# Patient Record
Sex: Female | Born: 1970 | Hispanic: No | Marital: Married | Smoking: Never smoker
Health system: Southern US, Community
[De-identification: ages and names within clinical notes are randomized; demographics above are authoritative.]

## PROBLEM LIST (undated history)

## (undated) DIAGNOSIS — F419 Anxiety disorder, unspecified: Secondary | ICD-10-CM

## (undated) DIAGNOSIS — N879 Dysplasia of cervix uteri, unspecified: Secondary | ICD-10-CM

## (undated) DIAGNOSIS — E282 Polycystic ovarian syndrome: Secondary | ICD-10-CM

## (undated) DIAGNOSIS — F32A Depression, unspecified: Secondary | ICD-10-CM

## (undated) DIAGNOSIS — R5383 Other fatigue: Secondary | ICD-10-CM

## (undated) DIAGNOSIS — R7301 Impaired fasting glucose: Secondary | ICD-10-CM

## (undated) DIAGNOSIS — I82419 Acute embolism and thrombosis of unspecified femoral vein: Secondary | ICD-10-CM

## (undated) DIAGNOSIS — I10 Essential (primary) hypertension: Secondary | ICD-10-CM

## (undated) DIAGNOSIS — R5381 Other malaise: Secondary | ICD-10-CM

## (undated) DIAGNOSIS — F329 Major depressive disorder, single episode, unspecified: Secondary | ICD-10-CM

## (undated) DIAGNOSIS — E559 Vitamin D deficiency, unspecified: Secondary | ICD-10-CM

## (undated) HISTORY — PX: CHOLECYSTECTOMY: SHX55

## (undated) HISTORY — DX: Other malaise: R53.83

## (undated) HISTORY — PX: ABDOMINAL HYSTERECTOMY: SHX81

## (undated) HISTORY — DX: Essential (primary) hypertension: I10

## (undated) HISTORY — DX: Depression, unspecified: F32.A

## (undated) HISTORY — DX: Acute embolism and thrombosis of unspecified femoral vein: I82.419

## (undated) HISTORY — DX: Impaired fasting glucose: R73.01

## (undated) HISTORY — DX: Major depressive disorder, single episode, unspecified: F32.9

## (undated) HISTORY — DX: Polycystic ovarian syndrome: E28.2

## (undated) HISTORY — DX: Other fatigue: R53.81

## (undated) HISTORY — DX: Dysplasia of cervix uteri, unspecified: N87.9

## (undated) HISTORY — DX: Vitamin D deficiency, unspecified: E55.9

---

## 1997-12-31 ENCOUNTER — Other Ambulatory Visit: Admission: RE | Admit: 1997-12-31 | Discharge: 1997-12-31 | Payer: Self-pay | Admitting: *Deleted

## 1998-03-29 ENCOUNTER — Emergency Department (HOSPITAL_COMMUNITY): Admission: EM | Admit: 1998-03-29 | Discharge: 1998-03-29 | Payer: Self-pay | Admitting: Emergency Medicine

## 1998-12-29 ENCOUNTER — Other Ambulatory Visit: Admission: RE | Admit: 1998-12-29 | Discharge: 1998-12-29 | Payer: Self-pay | Admitting: *Deleted

## 1999-01-16 ENCOUNTER — Other Ambulatory Visit: Admission: RE | Admit: 1999-01-16 | Discharge: 1999-01-16 | Payer: Self-pay | Admitting: *Deleted

## 1999-02-02 ENCOUNTER — Other Ambulatory Visit: Admission: RE | Admit: 1999-02-02 | Discharge: 1999-02-02 | Payer: Self-pay | Admitting: *Deleted

## 1999-03-23 ENCOUNTER — Encounter (INDEPENDENT_AMBULATORY_CARE_PROVIDER_SITE_OTHER): Payer: Self-pay | Admitting: Specialist

## 1999-03-23 ENCOUNTER — Observation Stay (HOSPITAL_COMMUNITY): Admission: RE | Admit: 1999-03-23 | Discharge: 1999-03-24 | Payer: Self-pay | Admitting: *Deleted

## 1999-10-05 ENCOUNTER — Encounter: Admission: RE | Admit: 1999-10-05 | Discharge: 1999-10-05 | Payer: Self-pay | Admitting: Family Medicine

## 1999-10-05 ENCOUNTER — Encounter: Payer: Self-pay | Admitting: Family Medicine

## 1999-11-16 ENCOUNTER — Other Ambulatory Visit: Admission: RE | Admit: 1999-11-16 | Discharge: 1999-11-16 | Payer: Self-pay | Admitting: *Deleted

## 2000-07-20 ENCOUNTER — Encounter: Payer: Self-pay | Admitting: *Deleted

## 2000-07-20 ENCOUNTER — Encounter: Admission: RE | Admit: 2000-07-20 | Discharge: 2000-07-20 | Payer: Self-pay | Admitting: *Deleted

## 2001-02-03 ENCOUNTER — Emergency Department (HOSPITAL_COMMUNITY): Admission: EM | Admit: 2001-02-03 | Discharge: 2001-02-03 | Payer: Self-pay | Admitting: Emergency Medicine

## 2001-03-06 ENCOUNTER — Other Ambulatory Visit: Admission: RE | Admit: 2001-03-06 | Discharge: 2001-03-06 | Payer: Self-pay | Admitting: *Deleted

## 2003-01-11 ENCOUNTER — Other Ambulatory Visit: Admission: RE | Admit: 2003-01-11 | Discharge: 2003-01-11 | Payer: Self-pay | Admitting: Obstetrics and Gynecology

## 2004-01-14 ENCOUNTER — Other Ambulatory Visit: Admission: RE | Admit: 2004-01-14 | Discharge: 2004-01-14 | Payer: Self-pay | Admitting: Obstetrics and Gynecology

## 2004-09-20 LAB — HM COLONOSCOPY

## 2005-03-26 ENCOUNTER — Other Ambulatory Visit: Admission: RE | Admit: 2005-03-26 | Discharge: 2005-03-26 | Payer: Self-pay | Admitting: Obstetrics and Gynecology

## 2005-05-27 ENCOUNTER — Ambulatory Visit: Payer: Self-pay | Admitting: Internal Medicine

## 2005-07-01 ENCOUNTER — Ambulatory Visit: Payer: Self-pay | Admitting: Gastroenterology

## 2006-05-06 ENCOUNTER — Other Ambulatory Visit: Admission: RE | Admit: 2006-05-06 | Discharge: 2006-05-06 | Payer: Self-pay | Admitting: Obstetrics and Gynecology

## 2007-05-31 ENCOUNTER — Ambulatory Visit: Payer: Self-pay | Admitting: Cardiology

## 2007-06-16 ENCOUNTER — Other Ambulatory Visit: Admission: RE | Admit: 2007-06-16 | Discharge: 2007-06-16 | Payer: Self-pay | Admitting: Obstetrics and Gynecology

## 2007-08-03 ENCOUNTER — Ambulatory Visit: Payer: Self-pay

## 2007-08-03 ENCOUNTER — Ambulatory Visit: Payer: Self-pay | Admitting: Cardiology

## 2008-03-03 ENCOUNTER — Emergency Department (HOSPITAL_COMMUNITY): Admission: EM | Admit: 2008-03-03 | Discharge: 2008-03-03 | Payer: Self-pay | Admitting: Emergency Medicine

## 2008-12-09 ENCOUNTER — Encounter: Admission: RE | Admit: 2008-12-09 | Discharge: 2008-12-09 | Payer: Self-pay | Admitting: Family Medicine

## 2009-01-30 ENCOUNTER — Encounter: Admission: RE | Admit: 2009-01-30 | Discharge: 2009-01-30 | Payer: Self-pay | Admitting: Family Medicine

## 2011-02-02 NOTE — Assessment & Plan Note (Signed)
[facility]                            CARDIOLOGY OFFICE NOTE   Brianna Zimmerman, Brianna Zimmerman                      MRN:          [id number]  DATE:05/31/2007                            DOB:          [birth year]    REFERRING PHYSICIAN:  Huntley Dec M.D. Carter   REASON FOR CONSULTATION:  Evaluate patient with chest pain and an  abnormal EKG.   HISTORY OF PRESENT ILLNESS:  The patient is a very pleasant, 40 year old  female without prior cardiac history. She has had a history of chest  discomfort since about March. She was diagnosed with costochondritis.  She has been managed with some nonsteroidals, but the pain is not  improved. In fact, yesterday she had a particularly severe episode of  discomfort. She went to [facility] and was noted to have an EKG with some  premature atrial contractions as well as diffuse T-wave flattening. I do  not know if there was an old EKG for comparison.   The patient describes the chest discomfort as a lower sternal  discomfort. It does get worse when she moves in a certain position and  takes a deep breath. It was 7/10 yesterday. There was also some nausea  and diaphoresis. It does not radiate to her jaw or down her arms. It  goes away only on its own but can persist for hours or an entire day. It  is not made worse with activity. Her most exerting activity is walking  at work. She does not have any shortness of breath. She does not have  any PND or orthopnea. She does not notice any palpitations. Had no  presyncope or syncope.   PAST MEDICAL HISTORY:  1. Hypertension.  2. Diabetes for 1 year.  3. Mild sleep apnea.   PAST SURGICAL HISTORY:  Hysterectomy.   ALLERGIES:  None.   MEDICATIONS:  1. Metformin 1000 mg daily.  2. Pristiq 50 mg daily.  3. Spironolactone 25 mg daily.   SOCIAL HISTORY:  The patient is a Customer service manager. She is married. She  has one 12 year old child. She has never smoked cigarettes. She does  occasionally drink alcohol.   FAMILY HISTORY:  Is noncontributory for early coronary disease, though  her father died in his 78s, and she is not sure of what.   REVIEW OF SYSTEMS:  Positive for occasional headaches, cough, reflux.  Negative for other systems.   PHYSICAL EXAMINATION:  The patient is in no acute distress. Blood  pressure 124/82, heart rate 82 and regular, weight 232 pounds, body mass  index 34.  HEENT:  Eyes unremarkable; pupils are equal, round, and reactive to  light; fundi within normal limits. Oral mucosal unremarkable.  NECK:  No jugular venous distention at 45 degrees. Carotid upstrokes  brisk and symmetrical. No bruits. No thyromegaly.  LYMPHATICS:  No cervical, axillary, or inguinal adenopathy.  LUNGS:  Clear to auscultation bilaterally.  BACK:  No costovertebral angle tenderness.  CHEST:  Unremarkable except for mild tenderness in the lower  costochondral junction.  HEART:  PMI not displaced or sustained. S1 and S2 within normal limits.  No S3, no S4, no clicks, no rubs, no murmurs.  ABDOMEN:  Obese, positive bowel sounds, normal in frequency and pitch,  no bruits, no rebound, no guarding, no midline pulsatile mass, no  hepatomegaly, no splenomegaly.  SKIN:  No rashes, no nodules.  EXTREMITIES:  2+ pulses throughout, no edema, no cyanosis, no clubbing.  NEUROLOGICAL:  Oriented to person, place, and time; cranial nerves II-  XII grossly intact; motor grossly intact.   EKG:  Sinus rhythm with premature atrial contractions, axis within  normal limits, QT interval slightly prolonged, nonspecific inferior and  lateral T-wave flattening.   ASSESSMENT AND PLAN:  1. Chest discomfort. The patient's chest discomfort is atypical.      However, she does have diabetes. She does not know her lipid      status. She does not know her father's family history. She has      atypical chest pain with abnormal EKG. Given all of this, the      pretest probability of  obstructive coronary disease is low but      still possible. Therefore, I think screening with a regular      exercise treadmill test is reasonable. This will also allow me to      risk stratify and give her prescription for exercise.  2. Blood pressure is well controlled, and she will continue the      medications as listed (she is actually on the spironolactone for      her skin, but I am sure it is serving as a blood pressure medicine      as well).  3. Diabetes. Dr. Lucianne Muss.  4. Followup. Will see the patient again at the time of the treadmill      test. I am looking forward to being able to give her a prescription      for exercise and counseling on weight loss.     Rollene Rotunda, MD, [facility]  Electronically Signed    JH/MedQ  DD: 05/31/2007  DT: 05/31/2007  Job #: [id number]   cc:   Eileen Stanford, M.D.

## 2011-02-02 NOTE — Procedures (Signed)
[facility]                              EXERCISE TREADMILL   Brianna Zimmerman, Brianna Zimmerman                      MRN:          [id number]  DATE:08/03/2007                            DOB:          Feb 16, 1971    PRIMARY CARE PHYSICIAN:  Dr. Brandon Melnick.   REASON FOR PRESENTATION:  Evaluate patient with chest discomfort.   PROCEDURE NOTE:  The patient was exercised using standard Bruce  protocol.  She was able to exercise for 9 minutes.  This completed stage  III.  She achieved her target heart rate with a maximum of 176 which was  96% of predicted.  She achieved 10.4 mets.  She had an appropriate blood  pressure response to the maximum of 176/78.  There were some PVCs both  with exercise and at rest.  She actually had some bigemini at rest but  did not have any symptoms related to this.  She had no chest discomfort,  neck or arm discomfort.  She had appropriate dyspnea.  She had no  ischemic ST-T wave changes.  She had a normal heart rate recovery.   CONCLUSION:  Negative exercise treadmill test.  No evidence of ischemia  or high grade obstructive coronary disease.  She had low risk features.  I think her exercise tolerance though is not good, given her age.   PLAN:  Based on the above, no further cardiovascular testing is  suggested.  I did give her a prescription for exercise and we discussed  this at length.   The patient does have premature ventricular contractions but she is not  noticing these.  No further therapy is warranted.     Rollene Rotunda, MD, [facility]  Electronically Signed    JH/MedQ  DD: 08/03/2007  DT: 08/03/2007  Job #: [id number]   cc:   Brandon Melnick, Dr.

## 2011-05-02 ENCOUNTER — Other Ambulatory Visit (INDEPENDENT_AMBULATORY_CARE_PROVIDER_SITE_OTHER): Payer: Self-pay | Admitting: General Surgery

## 2011-05-02 ENCOUNTER — Emergency Department (HOSPITAL_COMMUNITY): Payer: 59

## 2011-05-02 ENCOUNTER — Inpatient Hospital Stay (HOSPITAL_COMMUNITY): Payer: 59

## 2011-05-02 ENCOUNTER — Inpatient Hospital Stay (HOSPITAL_COMMUNITY)
Admission: EM | Admit: 2011-05-02 | Discharge: 2011-05-03 | DRG: 419 | Disposition: A | Discharge status: Home / Self Care | Payer: 59 | Attending: Surgery | Admitting: Surgery

## 2011-05-02 DIAGNOSIS — E282 Polycystic ovarian syndrome: Secondary | ICD-10-CM | POA: Diagnosis present

## 2011-05-02 DIAGNOSIS — F3289 Other specified depressive episodes: Secondary | ICD-10-CM | POA: Diagnosis present

## 2011-05-02 DIAGNOSIS — K8000 Calculus of gallbladder with acute cholecystitis without obstruction: Principal | ICD-10-CM | POA: Diagnosis present

## 2011-05-02 DIAGNOSIS — R1013 Epigastric pain: Secondary | ICD-10-CM

## 2011-05-02 DIAGNOSIS — R1011 Right upper quadrant pain: Secondary | ICD-10-CM

## 2011-05-02 DIAGNOSIS — F411 Generalized anxiety disorder: Secondary | ICD-10-CM | POA: Diagnosis present

## 2011-05-02 DIAGNOSIS — F329 Major depressive disorder, single episode, unspecified: Secondary | ICD-10-CM | POA: Diagnosis present

## 2011-05-02 DIAGNOSIS — K8010 Calculus of gallbladder with chronic cholecystitis without obstruction: Secondary | ICD-10-CM

## 2011-05-02 DIAGNOSIS — R112 Nausea with vomiting, unspecified: Secondary | ICD-10-CM | POA: Diagnosis present

## 2011-05-02 LAB — URINALYSIS, ROUTINE W REFLEX MICROSCOPIC
Bilirubin Urine: NEGATIVE
Glucose, UA: NEGATIVE mg/dL
Hgb urine dipstick: NEGATIVE
Ketones, ur: NEGATIVE mg/dL
Leukocytes, UA: NEGATIVE
Nitrite: NEGATIVE
Protein, ur: NEGATIVE mg/dL
Specific Gravity, Urine: 1.024 (ref 1.005–1.030)
Urobilinogen, UA: 1 mg/dL (ref 0.0–1.0)
pH: 6.5 (ref 5.0–8.0)

## 2011-05-02 LAB — COMPREHENSIVE METABOLIC PANEL
ALT: 19 U/L (ref 0–35)
Alkaline Phosphatase: 88 U/L (ref 39–117)
BUN: 9 mg/dL (ref 6–23)
CO2: 22 mEq/L (ref 19–32)
Calcium: 9 mg/dL (ref 8.4–10.5)
GFR calc Af Amer: >60 mL/min (ref >60)
GFR calc non Af Amer: >60 mL/min (ref >60)
Glucose, Bld: 132 mg/dL — ABNORMAL HIGH (ref 70–99)
Sodium: 135 mEq/L (ref 135–145)
Total Protein: 7.4 g/dL (ref 6.0–8.3)

## 2011-05-02 LAB — DIFFERENTIAL
Basophils Relative: 0 % (ref 0–1)
Eosinophils Absolute: 0.2 10*3/uL (ref 0.0–0.7)
Eosinophils Relative: 2 % (ref 0–5)
Lymphocytes Relative: 19 % (ref 12–46)
Monocytes Relative: 6 % (ref 3–12)
Neutro Abs: 8 10*3/uL — ABNORMAL HIGH (ref 1.7–7.7)
Neutrophils Relative %: 73 % (ref 43–77)

## 2011-05-02 LAB — CBC
HCT: 42.1 % (ref 36.0–46.0)
Hemoglobin: 14.8 g/dL (ref 12.0–15.0)
MCH: 30.6 pg (ref 26.0–34.0)
MCHC: 35.2 g/dL (ref 30.0–36.0)
RBC: 4.83 MIL/uL (ref 3.87–5.11)

## 2011-05-02 LAB — LIPASE, BLOOD: Lipase: 31 U/L (ref 11–59)

## 2011-05-03 LAB — URINE CULTURE

## 2011-05-11 NOTE — H&P (Signed)
NAMELAQUANDA, BICK NO.:  [id number]  MEDICAL RECORD NO.:  [id number]  LOCATION:  1527                         FACILITY:  [facility]  PHYSICIAN:  Lodema Pilot, MD       DATE OF BIRTH:  [birth year]  DATE OF ADMISSION:  05/02/2011 DATE OF DISCHARGE:  05/03/2011                             HISTORY & PHYSICAL   CHIEF COMPLAINT:  Abdominal pain.  HISTORY OF PRESENT ILLNESS:  This is a 40 year old female who awoke from sleep on Saturday (yesterday) with right upper quadrant pain, which she describes as "extreme cramping." She states that the pain radiates across her upper abdomen and has been unrelenting, which caused her to present to [facility].  She took some Motrin and she had some mild improvement with that.  However, the pain has been consistent since yesterday morning.  She has had associated nausea and nonbloody bloody emesis x1.  She denies any other associated symptoms.  She had 1 episode of nonbloody emesis.  She has no other exacerbating or relieving factors.  She denies any specific food association.  She states that her bowels are normal and denies any hematochezia or melena.  She denies any fevers or chills.  She denies any jaundice or reflux symptoms.  She denies any dysuria or hematuria.  PAST MEDICAL HISTORY:  Positive for polycystic ovarian syndrome and anxiety.  PAST SURGICAL HISTORY:  Hysterectomy.  FAMILY HISTORY:  Her mother had gallstones, otherwise family history is noncontributory.  SOCIAL HISTORY:  She denies tobacco use, illegal drugs, or alcohol use. She is married and lives with her husband.  REVIEW OF SYSTEMS:  Otherwise review of systems is negative except for as in HPI.  MEDICATIONS:  She is on Lexapro, oral contraceptive pills, and spironolactone.  ALLERGIES:  None.  PHYSICAL EXAMINATION:  GENERAL:  She is in no acute distress and nontoxic appearing, alert and oriented x3 sitting up in the bed. VITAL SIGNS:   Reviewed and are within normal limits and she is afebrile. HEENT:  Head is normocephalic and atraumatic.  Ears, nose, mouth, and throat are normal.  Her mucous membranes are moist and warm.  No evidence of bleeding or obvious lesions.  Sclerae are white. Conjunctivae normal. NECK:  Demonstrates a midline trachea with no stridor.  No palpable masses. LUNGS:  Clear to auscultation bilaterally without wheezes or rales and she has a normal respiratory effort.  No evidence of respiratory distress. HEART:  Normal to auscultation with normal rate and a regular rhythm. Pulses are normal and equal in all 4 extremities. ABDOMEN:  Soft and nondistended.  She has some mild-to-moderate right upper quadrant and epigastric tenderness, but no masses and no peritonitis and no evidence of hernia. MUSCULOSKELETAL:  Normal with full range of motion and normal strength. SKIN:  Normal with no obvious lesions and is warm and dry. NEUROLOGIC/PSYCHIATRIC:  Evaluation is normal with normal affect and mood and she responds appropriately to all questions. LABORATORY STUDIES:  Chemistry showed a white count 11.0, hemoglobin is 14.8, hematocrit is 42.1, platelets 208, 73% neutrophils.  Sodium is 135, potassium 3.9, chloride 101, bicarb is 22, BUN 9.  Creatinine is 0.67, glucose 132,  total bilirubin is 0.4, alk phos 88, AST is 21, ALT is 19, lipase 31.  UA is negative.  RADIOGRAPHIC STUDIES:  She had an ultrasound of the abdomen, which demonstrates gallstones with a normal common bile duct, no wall thickening.  She had a sonographic Murphy's sign.  ASSESSMENT:  Symptomatic cholelithiasis with possible cholecystitis given the duration of her symptoms.  PLAN:  We will plan for admission and IV fluid administration.  We will plan for laparoscopic cholecystectomy with intraoperative cholangiogram as soon as the operating room is available.  I discussed with her the risks of the procedure including infection,  bleeding, pain, scarring, persistent symptoms, diarrhea, retained stones, and need for open surgery and injury to bowel or bile ducts.  She expressed understanding and desires to proceed with laparoscopic cholecystectomy with intraoperative cholangiogram.          ______________________________ Lodema Pilot, MD     BL/MEDQ  D:  05/07/2011  T:  05/07/2011  Job:  [id number]  Electronically Signed by Lodema Pilot DO on 05/11/2011 09:45:50 AM

## 2011-05-17 NOTE — Op Note (Signed)
Brianna Zimmerman, Brianna Zimmerman NO.:  [id number]  MEDICAL RECORD NO.:  [id number]  LOCATION:  1527                         FACILITY:  [facility]  PHYSICIAN:  Sharlet Salina T. Latiqua Daloia, M.D.DATE OF BIRTH:  [birth year]  DATE OF PROCEDURE:  05/02/2011 DATE OF DISCHARGE:                              OPERATIVE REPORT   PREOPERATIVE. DIAGNOSES:  Cholelithiasis and acute cholecystitis.  POSTOPERATIVE DIAGNOSES:  Cholelithiasis and acute cholecystitis.  SURGICAL PROCEDURES:  Laparoscopic cholecystectomy with intraoperative cholangiogram.  SURGEON:  Sharlet Salina T. Deliana Avalos, M.D.  ANESTHESIA:  General.  BRIEF HISTORY:  This patient is a 40 year old female who presents with acute onset of severe epigastric and right upper quadrant abdominal pain.  Workup with a gallbladder ultrasound showing multiple gallstones, but no evidence of acute cholecystitis.  She has persistent pain and tenderness, so we have recommended proceeding with an urgent laparoscopic cholecystectomy with cholangiogram.  We discussed the nature of the procedure, the indications, the risks of anesthetic complications, bleeding, infection, bile leak, bile duct injury were discussed with husband.  She is agreeable and was brought to the operating room for this procedure.  DESCRIPTION OF OPERATION:  The patient was brought to the operating room, placed in supine position on the operating table and general endotracheal anesthesia was induced.  The abdomen was widely sterilely prepped and draped.  She had received preoperative IV antibiotics, and PAS were in place.  Correct patient and procedure were verified.  Local anesthesia was used to infiltrate trocar sites.  A 1.5 cm incision was made at the umbilicus.  Dissection was carried down to the midline fascia.  This is elevated between clamps and the fascia was incised for 1 cm and the peritoneum entered under direct vision.  Through a mattress suture of 0 Vicryl, the Hassan  trocar was placed and pneumoperitoneum established.  Under direct vision, a 11 cm  trocar was placed in subxiphoid area and two 5 mm trocars on the right subcostal margin.  The gallbladder was visualized and was tense and edematous. It was aspirated and decompressed .  The fundus was grasped and elevated up over the liver.  The infundibulum was exposed and retracted inferolaterally.  Tissue was very edematous. but  dissected relatively easily.  The peritoneum was incised anterior and posterior to Calot's triangle.  The fibrofatty tissue was stripped off the neck of the gallbladder toward the porta hepatis.  The distal gallbladder and Calot's triangle was thoroughly dissected.  The cystic duct gallbladder junction was identified and dissected to 360 degrees.  The cyst duct dissected out over about  1 cm. When the anatomy was clear, the cystic duct was clipped at the gallbladder junction and operative cholangiogram was obtained through the cystic duct.  This showed good filling of normal common bile duct, intrahepatic ducts with free flow into the duodenum and no filling defects.  The catheter was removed and the cystic duct was doubly clipped proximally and divided.  The anterior and posterior branches of the cystic artery were then clipped at the Calot's triangle and divided.  The gallbladder was dissected free from its bed using hook cautery, placed in an EndoCatch bag, and brought out through the umbilicus.  Complete  hemostasis was obtained of the gallbladder bed with cautery, and the right upper quadrant was thoroughly irrigated and suctioned to clear.  The Surgicel pack was left in the gallbladder fossa.  All CO2 was evacuated, trocars were removed, and the mattress sutures were secured at the  umbilicus.  Skin incisions were closed with subcuticular Monocryl and Dermabond.  Sponges and instrument counts were correct.  The patient was taken to recovery room in  good condition.     Lorne Skeens. Kalyse Meharg, M.D.     Tory Emerald  D:  05/02/2011  T:  05/03/2011  Job:  [id number]  Electronically Signed by Glenna Fellows M.D. on 05/17/2011 11:22:14 AM

## 2011-05-18 ENCOUNTER — Ambulatory Visit (INDEPENDENT_AMBULATORY_CARE_PROVIDER_SITE_OTHER): Payer: 59 | Admitting: Radiology

## 2011-05-18 ENCOUNTER — Encounter (INDEPENDENT_AMBULATORY_CARE_PROVIDER_SITE_OTHER): Payer: Self-pay

## 2011-05-18 DIAGNOSIS — K8010 Calculus of gallbladder with chronic cholecystitis without obstruction: Secondary | ICD-10-CM

## 2011-05-18 NOTE — Progress Notes (Signed)
Brianna Zimmerman is a 40 y.o. female who had a laparoscopic cholecystectomy with intraoperative cholangiogram.  The pathology report confirmed gallstobes and cholecystitis.  The patient reports that they are feeling well with normal bowel movements and good appetite.  The pre-operative symptoms of abdominal pain, nausea, and vomiting have resolved.    Physical examination - Incisions appear well-healed with no sign of infection or bleeding.   Abdomen - soft, non-tender  Impression:  s/p laparoscopic cholecystectomy  Plan:  She may resume a regular diet and full activity.  She may follow-up on a PRN basis.

## 2011-05-20 NOTE — Discharge Summary (Signed)
  [name], Zimmerman NO.:  [id number]  MEDICAL RECORD NO.:  [id number]  LOCATION:  1527                         FACILITY:  [facility]  PHYSICIAN:  Ardeth Sportsman, MD     DATE OF BIRTH:  09/18/1971  DATE OF ADMISSION:  05/02/2011 DATE OF DISCHARGE:  05/03/2011                              DISCHARGE SUMMARY   ADMISSION DIAGNOSES:  Cholelithiasis and acute cholecystitis.  DISCHARGE DIAGNOSES:  Cholelithiasis and acute cholecystitis.  PROCEDURES:  Laparoscopic cholecystectomy with intraoperative cholangiogram.  BRIEF HISTORY:  The patient is a 40 year old female who woke up from her sleep with right upper quadrant pain, which she describes as extreme cramping radiates across her upper abdomen, improved with pain medicines.  She underwent workup in the emergency room at [facility].  Ultrasound shows small gallstones, common bile duct was normal.  There was no wall thickening, but a positive Murphy sign.  She was evaluated by Dr. Biagio Quint and it was his impression she had acute cholecystitis and cholelithiasis.  She was admitted for treatment.  PAST MEDICAL HISTORY: 1. Polycystic ovarian syndrome. 2. Anxiety.  PAST SURGICAL HISTORY:  Hysterectomy.  MEDICATIONS:  Lexapro, birth control pills, and spirolactone  ALLERGIES:  None.  For further history and physical, please see the dictated note.  HOSPITAL COURSE:  The patient was admitted by Dr. Biagio Quint, taken to the OR on May 02, 2011, by Dr. Johna Sheriff who did a laparoscopic cholecystectomy, intraoperative arteriogram.  The patient tolerated the procedure well, returned to the floor in satisfactory condition.  Her diet has been advanced, and she has regular diet for breakfast.  She is still tender.  We plan to mobilize her, and adjust her pain medicines.   Her wounds all looks good.  If she continues do well, we anticipate discharge home after lunch.  She will go home on a preadmission medicines, which  include ibuprofen 200 mg 2 q.8 h. p.r.n., Ambien 10 mg h.s. p.r.n., Lexapro 10 mg daily, estradiol/norgestimate 1 daily, and spironolactone 25 mg daily.  She will return for followup at out clinic on May 18, 2011, at 2:45.  She was instructed to clean her wounds with plain soap and water.  Call for fever over 101, trouble urinating, any bleeding or redness or drainage from incision or increased abdominal pain.  New medicines will be oxycodone-APAP 5/325 mg 1-2 p.o. q.4 h. p.r.n..  CONDITION ON DISCHARGE:  Improved.     [name], P.A.   ______________________________ Ardeth Sportsman, MD    WDJ/MEDQ  D:  05/03/2011  T:  05/03/2011  Job:  [id number]  Electronically Signed by Sherrie George P.A. on 05/11/2011 06:21:02 PM Electronically Signed by Karie Soda MD on 05/20/2011 08:16:24 AM

## 2011-07-15 ENCOUNTER — Other Ambulatory Visit: Payer: Self-pay | Admitting: Obstetrics and Gynecology

## 2011-07-15 DIAGNOSIS — Z1231 Encounter for screening mammogram for malignant neoplasm of breast: Secondary | ICD-10-CM

## 2011-08-02 ENCOUNTER — Ambulatory Visit
Admission: RE | Admit: 2011-08-02 | Discharge: 2011-08-02 | Disposition: A | Discharge status: Home / Self Care | Payer: 59 | Source: Ambulatory Visit | Attending: Obstetrics and Gynecology | Admitting: Obstetrics and Gynecology

## 2011-08-02 DIAGNOSIS — Z1231 Encounter for screening mammogram for malignant neoplasm of breast: Secondary | ICD-10-CM

## 2011-08-06 ENCOUNTER — Emergency Department (HOSPITAL_COMMUNITY)
Admission: EM | Admit: 2011-08-06 | Discharge: 2011-08-07 | Disposition: A | Discharge status: Home / Self Care | Payer: 59 | Attending: Emergency Medicine | Admitting: Emergency Medicine

## 2011-08-06 ENCOUNTER — Emergency Department (HOSPITAL_COMMUNITY)
Admission: EM | Admit: 2011-08-06 | Discharge: 2011-08-06 | Discharge status: Against Medical Advice | Payer: 59 | Attending: Emergency Medicine | Admitting: Emergency Medicine

## 2011-08-06 ENCOUNTER — Encounter (HOSPITAL_COMMUNITY): Payer: Self-pay | Admitting: Emergency Medicine

## 2011-08-06 DIAGNOSIS — I82409 Acute embolism and thrombosis of unspecified deep veins of unspecified lower extremity: Secondary | ICD-10-CM | POA: Insufficient documentation

## 2011-08-06 DIAGNOSIS — M7989 Other specified soft tissue disorders: Secondary | ICD-10-CM | POA: Insufficient documentation

## 2011-08-06 DIAGNOSIS — M79609 Pain in unspecified limb: Secondary | ICD-10-CM | POA: Insufficient documentation

## 2011-08-06 DIAGNOSIS — I82402 Acute embolism and thrombosis of unspecified deep veins of left lower extremity: Secondary | ICD-10-CM

## 2011-08-06 HISTORY — DX: Anxiety disorder, unspecified: F41.9

## 2011-08-06 MED ORDER — HYDROCODONE-ACETAMINOPHEN 5-500 MG PO TABS: 1.0000 | ORAL_TABLET | Freq: Four times a day (QID) | ORAL | Status: AC | PRN

## 2011-08-06 MED ORDER — ENOXAPARIN (LOVENOX) PATIENT EDUCATION KIT
PACK | Freq: Once | Status: DC
  Filled 2011-08-06: qty 1

## 2011-08-06 MED ORDER — OXYCODONE-ACETAMINOPHEN 5-325 MG PO TABS
1.0000 | ORAL_TABLET | Freq: Once | ORAL | Status: AC
  Administered 2011-08-06: 1 via ORAL
  Filled 2011-08-06: qty 1

## 2011-08-06 MED ORDER — ENOXAPARIN SODIUM 100 MG/ML ~~LOC~~ SOLN
100.0000 mg | Freq: Once | SUBCUTANEOUS | Status: AC
  Administered 2011-08-06: 100 mg via SUBCUTANEOUS
  Filled 2011-08-06: qty 1

## 2011-08-06 MED ORDER — ENOXAPARIN SODIUM 150 MG/ML ~~LOC~~ SOLN: 1.5000 mg/kg | Freq: Every day | SUBCUTANEOUS | Status: AC

## 2011-08-06 NOTE — ED Provider Notes (Signed)
History    patient presents to the ED complaining of left lower leg swelling and pain for one day. Patient states she was walking yesterday when she noticed increasing pain to the left calf. The next day she noticed swelling to her left calf, therefore she visited a local [facility] for further evaluation. A venous Doppler ultrasound was obtained which shows evidence of a DVT to left lower extremity. Patient denies history of DVT in the past. She does admits to using birth control pills, but denies any recent surgery, prolonged bed rest, or long trip.  Patient denies fever, chest pain, shortness of breath, nausea, vomiting, diarrhea, abdominal pain, rash, weakness, numbness.    CSN: [id number] Arrival date & time: 08/06/2011  8:16 PM   First MD Initiated Contact with Patient 08/06/11 2152      Chief Complaint  Patient presents with  . DVT    (Consider location/radiation/quality/duration/timing/severity/associated sxs/prior treatment) HPI  Past Medical History  Diagnosis Date  . Anxiety     Past Surgical History  Procedure Date  . Abdominal hysterectomy   . Cholecystectomy     Family History  Problem Relation Age of Onset  . Cancer Father     History  Substance Use Topics  . Smoking status: Never Smoker   . Smokeless tobacco: Not on file  . Alcohol Use: Yes    OB History    Grav Para Term Preterm Abortions TAB SAB Ect Mult Living                  Review of Systems  All other systems reviewed and are negative.    Allergies  Tavist  Home Medications   Current Outpatient Rx  Name Route Sig Dispense Refill  . RISAQUAD PO CAPS Oral Take 1 capsule by mouth daily.      Marland Kitchen ESCITALOPRAM OXALATE 10 MG PO TABS Oral Take 10 mg by mouth at bedtime.     . OMEGA-3 FATTY ACIDS 1000 MG PO CAPS Oral Take 1 g by mouth daily.      Marland Kitchen NORGESTIMATE-ETH ESTRADIOL 0.25-35 MG-MCG PO TABS Oral Take 1 tablet by mouth daily.      Marland Kitchen ZOLPIDEM TARTRATE 10 MG PO TABS Oral Take 5 mg by  mouth at bedtime as needed. SLEEP      BP 185/105  Pulse 77  Temp(Src) 97.8 F (36.6 C) (Oral)  Resp 20  SpO2 97%  Physical Exam  Constitutional: She is oriented to person, place, and time. She appears well-developed and well-nourished. No distress.  HENT:  Head: Normocephalic and atraumatic.  Eyes: Conjunctivae are normal.  Neck: Normal range of motion. Neck supple.  Cardiovascular: Normal rate and regular rhythm.  Exam reveals no gallop and no friction rub.   No murmur heard. Pulmonary/Chest: Effort normal. No respiratory distress. She has no wheezes.  Abdominal: Soft. Bowel sounds are normal. There is no tenderness.  Musculoskeletal: Normal range of motion. She exhibits edema and tenderness.       Left lower leg: She exhibits tenderness, swelling and edema.       L lower leg with evidence of increase calf swelling L>R and tenderness on palpation.  No obvious rash, or other overlying skin changes.  Pedal pulse 2+ bilat  Neurological: She is alert and oriented to person, place, and time.    ED Course  Procedures (including critical care time)  Labs Reviewed - No data to display No results found.   No diagnosis found.    MDM  Ultrasounds of left lower extremities were performed by [facility] ([address] [place], [place] [phone]). On 08/06/2011 at 1612. Impression: Deep venous thrombosis in the left lower extremity is noted.  Patient has been given Lovenox, with appropriate instruction by pharmacy. She'll be discharged with Lovenox kit and with appropriate followup .  Patient voiced understanding, and agreement with plan. At this time she has no chest pain or shortness of breath concerning for PE.   11:37 PM Patient weight 218 pounds. Therefore, patient will be receiving Lovenox 150 mg subcutaneous daily. She will have her INR checked in the next 2-3 days. She will continue to be on Lovenox until her INR normalized.  Pt care has been discussed with my  attending, who agrees with plan.       Fayrene Helper, PA 08/06/11 [phone]

## 2011-08-06 NOTE — ED Notes (Signed)
Pt presenting to ed with c/o r/o dvt to left lower extremity. Pt had ultrasound performed today and was told to present to ed. Pt reports some swelling noted to left leg since last night

## 2011-08-06 NOTE — ED Provider Notes (Signed)
Medical screening examination/treatment/procedure(s) were conducted as a shared visit with non-physician practitioner(s) and myself.  I personally evaluated the patient during the encounter  Donnetta Hutching, MD 08/06/11 2358

## 2011-08-06 NOTE — ED Notes (Signed)
Patient presents with c/o pain to the left leg that started yesterday behind her left knee.  + popliteal pulse present, and dorsal pedis pulses.  Feet warm to touch, moves all ext well.  Patient was seen at [facility] and has paperwork with her.

## 2011-08-06 NOTE — ED Notes (Signed)
Pt report DVT in L lower leg.  Reports pain since last night.  Pt had [place] at [facility] and was told to come to ED.

## 2011-08-07 NOTE — ED Notes (Signed)
Removed iv

## 2011-08-07 NOTE — ED Notes (Signed)
Patient inst on use of Lovenox

## 2011-08-10 ENCOUNTER — Other Ambulatory Visit: Payer: Self-pay | Admitting: Obstetrics and Gynecology

## 2011-08-10 DIAGNOSIS — R928 Other abnormal and inconclusive findings on diagnostic imaging of breast: Secondary | ICD-10-CM

## 2011-08-25 ENCOUNTER — Ambulatory Visit
Admission: RE | Admit: 2011-08-25 | Discharge: 2011-08-25 | Disposition: A | Discharge status: Home / Self Care | Payer: 59 | Source: Ambulatory Visit | Attending: Obstetrics and Gynecology | Admitting: Obstetrics and Gynecology

## 2011-08-25 DIAGNOSIS — R928 Other abnormal and inconclusive findings on diagnostic imaging of breast: Secondary | ICD-10-CM

## 2012-01-24 ENCOUNTER — Other Ambulatory Visit: Payer: Self-pay | Admitting: Obstetrics and Gynecology

## 2012-01-24 DIAGNOSIS — N63 Unspecified lump in unspecified breast: Secondary | ICD-10-CM

## 2012-05-11 ENCOUNTER — Ambulatory Visit
Admission: RE | Admit: 2012-05-11 | Discharge: 2012-05-11 | Disposition: A | Discharge status: Home / Self Care | Payer: BC Managed Care – PPO | Source: Ambulatory Visit | Attending: Obstetrics and Gynecology | Admitting: Obstetrics and Gynecology

## 2012-05-11 ENCOUNTER — Other Ambulatory Visit: Payer: Self-pay | Admitting: Obstetrics and Gynecology

## 2012-05-11 DIAGNOSIS — N63 Unspecified lump in unspecified breast: Secondary | ICD-10-CM

## 2012-10-26 ENCOUNTER — Other Ambulatory Visit: Payer: Self-pay | Admitting: Obstetrics and Gynecology

## 2012-10-26 DIAGNOSIS — N63 Unspecified lump in unspecified breast: Secondary | ICD-10-CM

## 2012-11-09 ENCOUNTER — Ambulatory Visit
Admission: RE | Admit: 2012-11-09 | Discharge: 2012-11-09 | Disposition: A | Discharge status: Home / Self Care | Payer: BC Managed Care – PPO | Source: Ambulatory Visit | Attending: Obstetrics and Gynecology | Admitting: Obstetrics and Gynecology

## 2012-11-09 DIAGNOSIS — N63 Unspecified lump in unspecified breast: Secondary | ICD-10-CM

## 2012-12-27 ENCOUNTER — Encounter: Payer: Self-pay | Admitting: Family Medicine

## 2012-12-27 ENCOUNTER — Ambulatory Visit (INDEPENDENT_AMBULATORY_CARE_PROVIDER_SITE_OTHER): Payer: BC Managed Care – PPO | Admitting: Family Medicine

## 2012-12-27 VITALS — BP 131/84 | HR 64 | Wt 229.0 lb

## 2012-12-27 DIAGNOSIS — F3289 Other specified depressive episodes: Secondary | ICD-10-CM

## 2012-12-27 DIAGNOSIS — F411 Generalized anxiety disorder: Secondary | ICD-10-CM

## 2012-12-27 DIAGNOSIS — F32A Depression, unspecified: Secondary | ICD-10-CM

## 2012-12-27 DIAGNOSIS — F329 Major depressive disorder, single episode, unspecified: Secondary | ICD-10-CM

## 2012-12-27 DIAGNOSIS — K5900 Constipation, unspecified: Secondary | ICD-10-CM

## 2012-12-27 MED ORDER — FLUOXETINE HCL 40 MG PO CAPS: 40.0000 mg | ORAL_CAPSULE | Freq: Every day | ORAL | Status: DC

## 2012-12-27 MED ORDER — CITALOPRAM HYDROBROMIDE 40 MG PO TABS: 40.0000 mg | ORAL_TABLET | Freq: Every day | ORAL | Status: DC

## 2012-12-27 NOTE — Progress Notes (Signed)
  Subjective:    Patient ID: Brianna Zimmerman, female    DOB: March 25, 1971, 42 y.o.   MRN: [id number]  HPI  Tobi is here today to discuss a couple of issues. She needs to have her Celexa refilled.  She also wants to discuss her worsening constipation. She recently started taking a supplement called Cilica which is supposed to help with hair growth.  She has been trying an OTC fiber supplement which has not helped very much.     Review of Systems  Gastrointestinal: Positive for constipation. Negative for blood in stool.  Psychiatric/Behavioral: The patient is not nervous/anxious (Mood is good on Celexa).        Objective:   Physical Exam  Constitutional: She appears well-nourished. No distress.  Cardiovascular: Normal rate, regular rhythm and normal heart sounds.   Abdominal: Soft. Bowel sounds are normal. She exhibits no distension and no mass. There is no tenderness.  Psychiatric: She has a normal mood and affect. Her behavior is normal. Judgment and thought content normal.          Assessment & Plan:  1) Constipation  A)  Water  B)  Probiotics (Lemon Ginger Tea, Align, Activia)   C)  Fiber ( at least 30 grams per day) Veggies, Fruit, Fiber One, Bars)   D)  Exercise   E)  OTC Meds - Miralax (Prune Juice v PlumSmart vs Apple ) vs Stool Softener (Colace) vs Glycerin Suppository -  Avoid Senna Products  F)  RX - Amitiza vs Linzess   2)  Anxiety  A)  Refilled her Celexa

## 2012-12-27 NOTE — Patient Instructions (Addendum)
1) Constipation  A)  Water  B)  Probiotics (Lemon Ginger Tea, Align, Activia)   C)  Fiber ( at least 30 grams per day) Veggies, Fruit, Fiber One, Bars)   D)  Exercise   E)  OTC Meds - Miralax (Prune Juice v PlumSmart vs Apple ) vs Stool Softener (Colace) vs Glycerin Suppository -  Avoid Senna Products  F)  RX - Amitiza vs Linzess   Constipation, Adult Constipation is when a person has fewer than 3 bowel movements a week; has difficulty having a bowel movement; or has stools that are dry, hard, or larger than normal. As people grow older, constipation is more common. If you try to fix constipation with medicines that make you have a bowel movement (laxatives), the problem may get worse. Long-term laxative use may cause the muscles of the colon to become weak. A low-fiber diet, not taking in enough fluids, and taking certain medicines may make constipation worse. CAUSES   Certain medicines, such as antidepressants, pain medicine, iron supplements, antacids, and water pills.   Certain diseases, such as diabetes, irritable bowel syndrome (IBS), thyroid disease, or depression.   Not drinking enough water.   Not eating enough fiber-rich foods.   Stress or travel.  Lack of physical activity or exercise.  Not going to the restroom when there is the urge to have a bowel movement.  Ignoring the urge to have a bowel movement.  Using laxatives too much. SYMPTOMS   Having fewer than 3 bowel movements a week.   Straining to have a bowel movement.   Having hard, dry, or larger than normal stools.   Feeling full or bloated.   Pain in the lower abdomen.  Not feeling relief after having a bowel movement. DIAGNOSIS  Your caregiver will take a medical history and perform a physical exam. Further testing may be done for severe constipation. Some tests may include:   A barium enema X-ray to examine your rectum, colon, and sometimes, your small intestine.  A sigmoidoscopy to  examine your lower colon.  A colonoscopy to examine your entire colon. TREATMENT  Treatment will depend on the severity of your constipation and what is causing it. Some dietary treatments include drinking more fluids and eating more fiber-rich foods. Lifestyle treatments may include regular exercise. If these diet and lifestyle recommendations do not help, your caregiver may recommend taking over-the-counter laxative medicines to help you have bowel movements. Prescription medicines may be prescribed if over-the-counter medicines do not work.  HOME CARE INSTRUCTIONS   Increase dietary fiber in your diet, such as fruits, vegetables, whole grains, and beans. Limit high-fat and processed sugars in your diet, such as [place] fries, hamburgers, cookies, candies, and soda.   A fiber supplement may be added to your diet if you cannot get enough fiber from foods.   Drink enough fluids to keep your urine clear or pale yellow.   Exercise regularly or as directed by your caregiver.   Go to the restroom when you have the urge to go. Do not hold it.  Only take medicines as directed by your caregiver. Do not take other medicines for constipation without talking to your caregiver first. SEEK IMMEDIATE MEDICAL CARE IF:   You have bright red blood in your stool.   Your constipation lasts for more than 4 days or gets worse.   You have abdominal or rectal pain.   You have thin, pencil-like stools.  You have unexplained weight loss. MAKE SURE YOU:  Understand these instructions.  Will watch your condition.  Will get help right away if you are not doing well or get worse. Document Released: 06/04/2004 Document Revised: 11/29/2011 Document Reviewed: 08/10/2011 [facility] Patient Information 2013 [place].

## 2012-12-31 DIAGNOSIS — F411 Generalized anxiety disorder: Secondary | ICD-10-CM | POA: Insufficient documentation

## 2012-12-31 DIAGNOSIS — F32A Depression, unspecified: Secondary | ICD-10-CM | POA: Insufficient documentation

## 2012-12-31 DIAGNOSIS — K5900 Constipation, unspecified: Secondary | ICD-10-CM | POA: Insufficient documentation

## 2012-12-31 DIAGNOSIS — F329 Major depressive disorder, single episode, unspecified: Secondary | ICD-10-CM | POA: Insufficient documentation

## 2013-01-01 ENCOUNTER — Telehealth: Payer: Self-pay | Admitting: *Deleted

## 2013-01-01 NOTE — Telephone Encounter (Signed)
Pt has started taking probiotics.  She will contact us for samples of Amitiza or Linzess if her symptoms do not improve. PG

## 2013-02-19 ENCOUNTER — Ambulatory Visit: Payer: BC Managed Care – PPO | Admitting: Internal Medicine

## 2013-02-19 VITALS — BP 142/104 | HR 80 | Temp 98.7°F | Resp 16 | Ht 69.5 in | Wt 222.0 lb

## 2013-02-19 DIAGNOSIS — R05 Cough: Secondary | ICD-10-CM

## 2013-02-19 DIAGNOSIS — H9203 Otalgia, bilateral: Secondary | ICD-10-CM

## 2013-02-19 DIAGNOSIS — H9209 Otalgia, unspecified ear: Secondary | ICD-10-CM

## 2013-02-19 DIAGNOSIS — R509 Fever, unspecified: Secondary | ICD-10-CM

## 2013-02-19 MED ORDER — AZITHROMYCIN 250 MG PO TABS: ORAL_TABLET | ORAL | Status: DC

## 2013-02-19 MED ORDER — HYDROCODONE-HOMATROPINE 5-1.5 MG/5ML PO SYRP: 5.0000 mL | ORAL_SOLUTION | Freq: Four times a day (QID) | ORAL | Status: DC | PRN

## 2013-02-20 NOTE — Progress Notes (Signed)
  Subjective:    Patient ID: Brianna Zimmerman, female    DOB: 1971-02-14, 42 y.o.   MRN: [id number]  HPI complaining of sore throat with fatigue for 72 hours sudden onset/ cough this started on day 2 and a slightly productive/no rhinorrhea/no fever unless low-grade Both ears hurt without change in hearing  Patient Active Problem List   Diagnosis Date Noted  . Depression 12/31/2012  . Anxiety state, unspecified 12/31/2012  . Unspecified constipation 12/31/2012   Dr Alberteen Sam  Review of Systems Noncontributory     Objective:   Physical Exam BP 142/104  Pulse 80  Temp(Src) 98.7 F (37.1 C) (Oral)  Resp 16  Ht 5' 9.5" (1.765 m)  Wt 222 lb (100.699 kg)  BMI 32.32 kg/m2  SpO2 96% TMs clear/nares clear Oropharynx injected with exudate 2+ a.c. nodes tender Lungs with scattered rhonchi       Assessment & Plan: Problem #2   Problem #1 pharyngitis and cough?  Mycoplasma  Otalgia secondary Problem #2 obesity Problem #3 elevated blood pressure without diagnosis of hypertension  Meds ordered this encounter  Medications  . HYDROcodone-homatropine (HYCODAN) 5-1.5 MG/5ML syrup    Sig: Take 5 mLs by mouth every 6 (six) hours as needed for cough.    Dispense:  120 mL    Refill:  0  . azithromycin (ZITHROMAX) 250 MG tablet    Sig: As packaged    Dispense:  6 tablet    Refill:  0   Followup with primary care for recheck

## 2013-03-08 ENCOUNTER — Telehealth: Payer: Self-pay

## 2013-03-08 NOTE — Telephone Encounter (Signed)
She is using mucinex for this. I have advised her to return if worse, but she can use Zytec or Allegra also. Fever or SOB= return sooner. Patient agrees.

## 2013-03-08 NOTE — Telephone Encounter (Signed)
Pt states that she was seen here a couple of weeks ago for a cough, pt states that she is continuing to experience coughing and sinus issues. Please Advise. Best# [phone] [facility] on [address]

## 2013-03-09 ENCOUNTER — Ambulatory Visit: Payer: BC Managed Care – PPO

## 2013-03-09 ENCOUNTER — Ambulatory Visit: Payer: BC Managed Care – PPO | Admitting: Family Medicine

## 2013-03-09 VITALS — BP 130/80 | HR 64 | Temp 98.4°F | Resp 16 | Ht 69.0 in | Wt 226.0 lb

## 2013-03-09 DIAGNOSIS — R05 Cough: Secondary | ICD-10-CM

## 2013-03-09 LAB — POCT CBC
Granulocyte percent: 62.5 %G (ref 37–80)
Hemoglobin: 14.4 g/dL (ref 12.2–16.2)
Lymph, poc: 2.4 (ref 0.6–3.4)
MCHC: 31.6 g/dL — AB (ref 31.8–35.4)
MPV: 8.7 fL (ref 0–99.8)
POC Granulocyte: 5.2 (ref 2–6.9)
POC MID %: 8.4 %M (ref 0–12)

## 2013-03-09 MED ORDER — HYDROCODONE-HOMATROPINE 5-1.5 MG/5ML PO SYRP: 5.0000 mL | ORAL_SOLUTION | Freq: Four times a day (QID) | ORAL | Status: DC | PRN

## 2013-03-09 MED ORDER — PREDNISONE 20 MG PO TABS: ORAL_TABLET | ORAL | Status: DC

## 2013-03-09 NOTE — Patient Instructions (Addendum)
Please use the cough syrup as needed, but remember it can cause sedation.    Use the prednisone as directed.  If you are not better in the next few days please let me konw- Sooner if worse.

## 2013-03-09 NOTE — Progress Notes (Signed)
[facility] [address], [place] [address] [phone]  Date:  03/09/2013   Name:  Brianna Zimmerman   DOB:  04-16-1971   MRN:  [id number]  PCP:  Ellender Hose, MD    Chief Complaint: Sore Throat, Cough and Otalgia   History of Present Illness:  Brianna Zimmerman is a 42 y.o. very pleasant female patient who presents with the following:  She was here on 6/2 with a cough/ pharyngitis.  She was treated with azithromycin and hycodan  She continues to have a severe dry cough. She does not have any drainage that she is aware of, but she does have a ST.   Both ears are tender, her hearing is ok.    She feels tired and achy, but nothing acute.   No GI symptoms. She has not noted a fever.   She is generally healthy, her only usual medication is prozac.    Patient Active Problem List   Diagnosis Date Noted  . Depression 12/31/2012  . Anxiety state, unspecified 12/31/2012  . Unspecified constipation 12/31/2012    Past Medical History  Diagnosis Date  . Anxiety   . Depression   . PCOS (polycystic ovarian syndrome)   . DVT of deep femoral vein   . Cervical dysplasia     Past Surgical History  Procedure Laterality Date  . Abdominal hysterectomy    . Cholecystectomy      History  Substance Use Topics  . Smoking status: Never Smoker   . Smokeless tobacco: Not on file  . Alcohol Use: Yes    Family History  Problem Relation Age of Onset  . Cancer Father     Allergies  Allergen Reactions  . Tavist Hives    Medication list has been reviewed and updated.  Current Outpatient Prescriptions on File Prior to Visit  Medication Sig Dispense Refill  . FLUoxetine (PROZAC) 40 MG capsule Take 1 capsule (40 mg total) by mouth daily.  90 capsule  1  . azithromycin (ZITHROMAX) 250 MG tablet As packaged  6 tablet  0  . HYDROcodone-homatropine (HYCODAN) 5-1.5 MG/5ML syrup Take 5 mLs by mouth every 6 (six) hours as needed for cough.  120 mL  0   No current  facility-administered medications on file prior to visit.    Review of Systems:  As per HPI- otherwise negative.   Physical Examination: Filed Vitals:   03/09/13 1724  BP: 154/94  Pulse: 64  Temp: 98.4 F (36.9 C)  Resp: 16   Filed Vitals:   03/09/13 1724  Height: 5\' 9"  (1.753 m)  Weight: 226 lb (102.513 kg)   Body mass index is 33.36 kg/(m^2). Ideal Body Weight: Weight in (lb) to have BMI = 25: 168.9  GEN: WDWN, NAD, Non-toxic, A & O x 3, looks well HEENT: Atraumatic, Normocephalic. Neck supple. No masses, No LAD.  Bilateral TM wnl, oropharynx normal.  PEERL,EOMI.   Ears and Nose: No external deformity. CV: RRR, No M/G/R. No JVD. No thrill. No extra heart sounds. PULM: CTA B, no wheezes, crackles, rhonchi. No retractions. No resp. distress. No accessory muscle use. ABD: S, NT, ND EXTR: No c/c/e NEURO Normal gait.  PSYCH: Normally interactive. Conversant. Not depressed or anxious appearing.  Calm demeanor.   [facility] reading (PRIMARY) by  Dr. Patsy Lager. CXR: negative  Results for orders placed in visit on 03/09/13  POCT CBC      Result Value Range   WBC 8.4  4.6 -  10.2 K/uL   Lymph, poc 2.4  0.6 - 3.4   POC LYMPH PERCENT 29.1  10 - 50 %L   MID (cbc) 0.7  0 - 0.9   POC MID % 8.4  0 - 12 %M   POC Granulocyte 5.2  2 - 6.9   Granulocyte percent 62.5  37 - 80 %G   RBC 4.73  4.04 - 5.48 M/uL   Hemoglobin 14.4  12.2 - 16.2 g/dL   HCT, POC 16.1  09.6 - 47.9 %   MCV 96.1  80 - 97 fL   MCH, POC 30.4  27 - 31.2 pg   MCHC 31.6 (*) 31.8 - 35.4 g/dL   RDW, POC 04.5     Platelet Count, POC 245  142 - 424 K/uL   MPV 8.7  0 - 99.8 fL   CHEST - 2 VIEW  Comparison: 01/30/2009  Findings: The heart size and mediastinal contours are within normal limits. Both lungs are clear. The visualized skeletal structures are unremarkable.  IMPRESSION: Negative exam.  Clinically significant discrepancy from primary report, if provided: None    Assessment and Plan: Cough - Plan:  POCT CBC, DG Chest 2 View, HYDROcodone-homatropine (HYCODAN) 5-1.5 MG/5ML syrup, predniSONE (DELTASONE) 20 MG tablet   Persistent cough.  At this time I do not think she has a persistent bacterial infection.  CXR and CBC are reassuring.  Will treat her with a short course of prednisone for bronchospasm, hycodan as needed for cough.  If not better soon please let me know- Sooner if worse.         Signed Abbe Amsterdam, MD

## 2013-03-14 ENCOUNTER — Other Ambulatory Visit: Payer: Self-pay | Admitting: Family Medicine

## 2013-03-14 DIAGNOSIS — R05 Cough: Secondary | ICD-10-CM

## 2013-03-15 ENCOUNTER — Ambulatory Visit (INDEPENDENT_AMBULATORY_CARE_PROVIDER_SITE_OTHER): Payer: BC Managed Care – PPO | Admitting: Family Medicine

## 2013-03-15 ENCOUNTER — Other Ambulatory Visit: Payer: Self-pay | Admitting: Family Medicine

## 2013-03-15 VITALS — BP 143/106 | HR 65 | Wt 226.0 lb

## 2013-03-15 DIAGNOSIS — R05 Cough: Secondary | ICD-10-CM

## 2013-03-15 DIAGNOSIS — R059 Cough, unspecified: Secondary | ICD-10-CM

## 2013-03-15 DIAGNOSIS — R5381 Other malaise: Secondary | ICD-10-CM

## 2013-03-15 MED ORDER — CYANOCOBALAMIN 1000 MCG/ML IJ SOLN
1000.0000 ug | Freq: Once | INTRAMUSCULAR | Status: AC
  Administered 2013-03-15: 1000 ug via INTRAMUSCULAR

## 2013-03-15 MED ORDER — HYDROCOD POLST-CHLORPHEN POLST 10-8 MG/5ML PO LQCR: 5.0000 mL | Freq: Two times a day (BID) | ORAL | Status: DC | PRN

## 2013-03-15 MED ORDER — PANTOPRAZOLE SODIUM 40 MG PO TBEC: 40.0000 mg | DELAYED_RELEASE_TABLET | Freq: Every day | ORAL | Status: DC

## 2013-03-15 MED ORDER — BENZONATATE 200 MG PO CAPS: 200.0000 mg | ORAL_CAPSULE | Freq: Three times a day (TID) | ORAL | Status: DC | PRN

## 2013-03-15 NOTE — Telephone Encounter (Signed)
Alden Benjamin, CMA called pt- she is still coughing and feels worse.  We will refill her cough syrup but also asked her to come back in for a recheck as soon as she can.  She agreed to do so.

## 2013-03-15 NOTE — Progress Notes (Signed)
  Subjective:    Patient ID: [name], female    DOB: 06-Jul-1971, 42 y.o.   MRN: [id number]  Brianna Zimmerman is here today complaining of URI symptoms.  She was recently seen at the [facility] and was given a round of Z-Pack and Prednisone but her symptoms have not improved. She continues to cough and she feels very fatigued.    URI  This is a recurrent problem. The current episode started 1 to 4 weeks ago. There has been no fever. Associated symptoms include congestion, coughing, headaches and a sore throat. She has tried antihistamine and acetaminophen for the symptoms.      Review of Systems  Constitutional: Positive for fatigue. Negative for fever and chills.  HENT: Positive for congestion, sore throat and sinus pressure.   Eyes: Negative for pain.  Respiratory: Positive for cough.   Neurological: Positive for headaches.   Past Medical History  Diagnosis Date  . Anxiety   . Depression   . PCOS (polycystic ovarian syndrome)   . DVT of deep femoral vein   . Cervical dysplasia   . Unspecified vitamin D deficiency   . Impaired fasting glucose   . Other malaise and fatigue    Family History  Problem Relation Age of Onset  . Cancer Father    History   Social History Narrative   Marital Status: Married Midwife)    Children:  Son [name])    Pets: Dogs (2)    Living Situation: Lives with husband and son   Occupation: Higher education careers adviser Title)    Education: Engineer, agricultural; Scientific laboratory technician   Tobacco Use/Exposure:  None    Alcohol Use:  White Wine (2 glasses per week)    Drug Use:  None    Diet:  Regular   Exercise:  None    Hobbies: Reading, Traveling                Objective:   Physical Exam  Constitutional: She appears well-nourished. No distress.  HENT:  Head: Normocephalic.  Mouth/Throat: No oropharyngeal exudate.  Eyes: Conjunctivae are normal. Right eye exhibits no discharge. Left eye exhibits no discharge.  Neck: Neck supple.   Cardiovascular: Normal rate, regular rhythm and normal heart sounds.  Exam reveals no gallop and no friction rub.   No murmur heard. Pulmonary/Chest: Effort normal and breath sounds normal. She has no wheezes. She exhibits no tenderness.  Lymphadenopathy:    She has no cervical adenopathy.  Neurological: She is alert.  Skin: Skin is warm and dry. No rash noted.  Psychiatric: She has a normal mood and affect.          Assessment & Plan:

## 2013-03-15 NOTE — Patient Instructions (Addendum)
1)  Ear Congestion - Sudafed (Behind the Counter) 120 mg 1-2 times per day.    2)  Cough - Delsym 2 teaspoons 2 times per day; Tessalon Perles 1 capsule 3 times per day; Tussionex 1 teaspoon twice a day; Hot shower; Vick's Vapor Rub; Umcka Cold Care (2 droppers 3-4 x per day).  If all else fails then try some Protonix 40 mg one per day.     Cough, Adult  A cough is a reflex that helps clear your throat and airways. It can help heal the body or may be a reaction to an irritated airway. A cough may only last 2 or 3 weeks (acute) or may last more than 8 weeks (chronic).  CAUSES Acute cough:  Viral or bacterial infections. Chronic cough:  Infections.  Allergies.  Asthma.  Post-nasal drip.  Smoking.  Heartburn or acid reflux.  Some medicines.  Chronic lung problems (COPD).  Cancer. SYMPTOMS   Cough.  Fever.  Chest pain.  Increased breathing rate.  High-pitched whistling sound when breathing (wheezing).  Colored mucus that you cough up (sputum). TREATMENT   A bacterial cough may be treated with antibiotic medicine.  A viral cough must run its course and will not respond to antibiotics.  Your caregiver may recommend other treatments if you have a chronic cough. HOME CARE INSTRUCTIONS   Only take over-the-counter or prescription medicines for pain, discomfort, or fever as directed by your caregiver. Use cough suppressants only as directed by your caregiver.  Use a cold steam vaporizer or humidifier in your bedroom or home to help loosen secretions.  Sleep in a semi-upright position if your cough is worse at night.  Rest as needed.  Stop smoking if you smoke. SEEK IMMEDIATE MEDICAL CARE IF:   You have pus in your sputum.  Your cough starts to worsen.  You cannot control your cough with suppressants and are losing sleep.  You begin coughing up blood.  You have difficulty breathing.  You develop pain which is getting worse or is uncontrolled with  medicine.  You have a fever. MAKE SURE YOU:   Understand these instructions.  Will watch your condition.  Will get help right away if you are not doing well or get worse. Document Released: 03/05/2011 Document Revised: 11/29/2011 Document Reviewed: 03/05/2011 [facility] Patient Information 2014 [place], [place].

## 2013-04-26 ENCOUNTER — Encounter: Payer: Self-pay | Admitting: Family Medicine

## 2013-04-26 DIAGNOSIS — R5381 Other malaise: Secondary | ICD-10-CM | POA: Insufficient documentation

## 2013-04-26 DIAGNOSIS — R059 Cough, unspecified: Secondary | ICD-10-CM | POA: Insufficient documentation

## 2013-04-26 DIAGNOSIS — R05 Cough: Secondary | ICD-10-CM | POA: Insufficient documentation

## 2013-04-26 NOTE — Assessment & Plan Note (Signed)
She was given additional medications to help control her symptoms.  If her cough persists she may try some Protonix to see if this will help her cough.

## 2013-04-26 NOTE — Assessment & Plan Note (Signed)
She received a Vitamin B-12 shot.

## 2013-06-15 ENCOUNTER — Emergency Department (HOSPITAL_COMMUNITY): Payer: BC Managed Care – PPO

## 2013-06-15 ENCOUNTER — Emergency Department (HOSPITAL_COMMUNITY)
Admission: EM | Admit: 2013-06-15 | Discharge: 2013-06-15 | Disposition: A | Discharge status: Home / Self Care | Payer: BC Managed Care – PPO | Attending: Emergency Medicine | Admitting: Emergency Medicine

## 2013-06-15 ENCOUNTER — Encounter (HOSPITAL_COMMUNITY): Payer: Self-pay | Admitting: *Deleted

## 2013-06-15 DIAGNOSIS — Z86718 Personal history of other venous thrombosis and embolism: Secondary | ICD-10-CM | POA: Insufficient documentation

## 2013-06-15 DIAGNOSIS — Z79899 Other long term (current) drug therapy: Secondary | ICD-10-CM | POA: Insufficient documentation

## 2013-06-15 DIAGNOSIS — F329 Major depressive disorder, single episode, unspecified: Secondary | ICD-10-CM | POA: Insufficient documentation

## 2013-06-15 DIAGNOSIS — M79609 Pain in unspecified limb: Secondary | ICD-10-CM

## 2013-06-15 DIAGNOSIS — Z791 Long term (current) use of non-steroidal anti-inflammatories (NSAID): Secondary | ICD-10-CM | POA: Insufficient documentation

## 2013-06-15 DIAGNOSIS — Z8639 Personal history of other endocrine, nutritional and metabolic disease: Secondary | ICD-10-CM | POA: Insufficient documentation

## 2013-06-15 DIAGNOSIS — M76899 Other specified enthesopathies of unspecified lower limb, excluding foot: Secondary | ICD-10-CM

## 2013-06-15 DIAGNOSIS — M7650 Patellar tendinitis, unspecified knee: Secondary | ICD-10-CM | POA: Insufficient documentation

## 2013-06-15 DIAGNOSIS — F411 Generalized anxiety disorder: Secondary | ICD-10-CM | POA: Insufficient documentation

## 2013-06-15 DIAGNOSIS — F3289 Other specified depressive episodes: Secondary | ICD-10-CM | POA: Insufficient documentation

## 2013-06-15 DIAGNOSIS — Z87410 Personal history of cervical dysplasia: Secondary | ICD-10-CM | POA: Insufficient documentation

## 2013-06-15 DIAGNOSIS — Z862 Personal history of diseases of the blood and blood-forming organs and certain disorders involving the immune mechanism: Secondary | ICD-10-CM | POA: Insufficient documentation

## 2013-06-15 MED ORDER — IBUPROFEN 600 MG PO TABS: 600.0000 mg | ORAL_TABLET | Freq: Four times a day (QID) | ORAL | Status: DC | PRN

## 2013-06-15 NOTE — Progress Notes (Signed)
*  Preliminary Results* Left lower extremity venous duplex completed. Left lower extremity is negative for deep vein thrombosis. There is no evidence of left Baker's cyst.  06/15/2013 5:25 PM  Gertie Fey, RVT, RDCS, RDMS

## 2013-06-15 NOTE — ED Provider Notes (Signed)
CSN: [id number]     Arrival date & time 06/15/13  1457 History   First MD Initiated Contact with Patient 06/15/13 1829     Chief Complaint  Patient presents with  . Leg Pain   (Consider location/radiation/quality/duration/timing/severity/associated sxs/prior Treatment) Patient is a 42 y.o. female presenting with leg pain. The history is provided by the patient.  Leg Pain Associated symptoms: no back pain and no fatigue    patient presents with pain in her left knee. That began today. Worse with movement. She's had a previous DVT and is worried it could be that. No trauma. No chest pain. No numbness or weakness. Train of the knee.  Past Medical History  Diagnosis Date  . Anxiety   . Depression   . PCOS (polycystic ovarian syndrome)   . DVT of deep femoral vein   . Cervical dysplasia   . Unspecified vitamin D deficiency   . Impaired fasting glucose   . Other malaise and fatigue    Past Surgical History  Procedure Laterality Date  . Abdominal hysterectomy    . Cholecystectomy     Family History  Problem Relation Age of Onset  . Cancer Father    History  Substance Use Topics  . Smoking status: Never Smoker   . Smokeless tobacco: Not on file  . Alcohol Use: Yes   OB History   Grav Para Term Preterm Abortions TAB SAB Ect Mult Living                 Review of Systems  Constitutional: Negative for fatigue.  Cardiovascular: Negative for chest pain, palpitations and leg swelling.  Musculoskeletal: Negative for back pain, joint swelling, arthralgias and gait problem.  Hematological: Negative for adenopathy. Does not bruise/bleed easily.    Allergies  Tavist  Home Medications   Current Outpatient Rx  Name  Route  Sig  Dispense  Refill  . FLUoxetine (PROZAC) 40 MG capsule   Oral   Take 1 capsule (40 mg total) by mouth daily.   90 capsule   1   . HYDROMET 5-1.5 MG/5ML syrup      TAKE 5 MLS BY MOUTH EVERY 6 HOURS AS NEEDED FOR COUGH   120 mL   0   . ibuprofen  (ADVIL,MOTRIN) 600 MG tablet   Oral   Take 1 tablet (600 mg total) by mouth every 6 (six) hours as needed for pain.   20 tablet   0    BP 134/90  Pulse 64  Temp(Src) 98.8 F (37.1 C)  Resp 20  SpO2 98% Physical Exam  Constitutional: She appears well-developed and well-nourished.  HENT:  Head: Normocephalic.  Musculoskeletal: She exhibits tenderness.  Tenderness over left knee medially over superior medial collateral ligament. Knee is stable. No edema. No effusion.    ED Course  Procedures (including critical care time) Labs Review Labs Reviewed - No data to display Imaging Review Dg Knee Complete 4 Views Left  06/15/2013   CLINICAL DATA:  Posterior left knee pain since today, history of DVT left leg  EXAM: LEFT KNEE - COMPLETE 4+ VIEW  COMPARISON:  None  Correlation: MRI left knee 03/27/2008  FINDINGS: Large patellar spur at quadriceps tendon insertion.  Osseous mineralization normal.  Joint spaces preserved.  No acute fracture, dislocation or bone destruction.  No knee joint effusion.  IMPRESSION: No acute osseous abnormalities.   Electronically Signed   By: Ulyses Southward M.D.   On: 06/15/2013 19:41    MDM  1. Tendinitis of knee    Patient with pain of right knee. Tender over tendon. X-ray reassuring. Negative Doppler. Will discharge home with anti-inflammatories   Juliet Rude. Rubin Payor, MD 06/15/13 2011

## 2013-06-15 NOTE — ED Notes (Signed)
The pt is c/o lt calf pain since 1000a today.  No long car trips or flights no injury.    dvt 2012.  lmp  hys

## 2013-06-15 NOTE — ED Notes (Signed)
Pt states she began having left leg pain in the back of her knee and she thought she may have a blood clot. Pt denies any sob, swelling. Called PCP and was told to come here to be evaluated. Pt rates pain behind left knee 3/10.

## 2013-06-15 NOTE — ED Notes (Signed)
Discharge instructions reviewed with pt. Pt verbalized understanding.   

## 2013-06-15 NOTE — ED Notes (Signed)
Orders for doppler study per dr Anitra Lauth

## 2013-07-17 ENCOUNTER — Other Ambulatory Visit: Payer: Self-pay | Admitting: Family Medicine

## 2013-07-19 ENCOUNTER — Other Ambulatory Visit: Payer: Self-pay | Admitting: Family Medicine

## 2013-08-22 ENCOUNTER — Other Ambulatory Visit: Payer: Self-pay | Admitting: Family Medicine

## 2013-08-23 NOTE — Telephone Encounter (Signed)
Brianna Zimmerman,  We refilled her "courtesy 30 day refill."  But since she doesn't have an appt I'm denying this request.  If she makes her appt we can refill it until you bring her.  Let me know. PG

## 2013-08-29 ENCOUNTER — Encounter: Payer: Self-pay | Admitting: Family Medicine

## 2013-08-29 ENCOUNTER — Encounter (INDEPENDENT_AMBULATORY_CARE_PROVIDER_SITE_OTHER): Payer: Self-pay

## 2013-08-29 ENCOUNTER — Ambulatory Visit (INDEPENDENT_AMBULATORY_CARE_PROVIDER_SITE_OTHER): Payer: BC Managed Care – PPO | Admitting: Family Medicine

## 2013-08-29 VITALS — BP 133/84 | HR 58 | Resp 16 | Wt 227.0 lb

## 2013-08-29 DIAGNOSIS — Z23 Encounter for immunization: Secondary | ICD-10-CM

## 2013-08-29 DIAGNOSIS — F411 Generalized anxiety disorder: Secondary | ICD-10-CM

## 2013-08-29 DIAGNOSIS — K5900 Constipation, unspecified: Secondary | ICD-10-CM

## 2013-08-29 MED ORDER — FLUOXETINE HCL 40 MG PO CAPS: 40.0000 mg | ORAL_CAPSULE | Freq: Every day | ORAL | Status: DC

## 2013-08-29 NOTE — Patient Instructions (Addendum)
1)  Anxiety - Continue on the Prozac.  2)  Constipation - Try the colonoscopy prep as we discussed.  Other things you can try  Water  Exercise Fiber (30 - 50 grams) per day Miralax 1 cap in 8 oz of juice (Prune/Plum) up to 3 x per day Stool Softener (Colace) 100 mg twice a day Probiotic (Align)  Dr. Neil Crouch Recipe  3)  Pap Smear - Find out if you have been tested for HPV 16/18, what was your diagnosis and how often they feel that you need a pap smear.      Constipation, Adult Constipation is when a person has fewer than 3 bowel movements a week; has difficulty having a bowel movement; or has stools that are dry, hard, or larger than normal. As people grow older, constipation is more common. If you try to fix constipation with medicines that make you have a bowel movement (laxatives), the problem may get worse. Long-term laxative use may cause the muscles of the colon to become weak. A low-fiber diet, not taking in enough fluids, and taking certain medicines may make constipation worse. CAUSES   Certain medicines, such as antidepressants, pain medicine, iron supplements, antacids, and water pills.   Certain diseases, such as diabetes, irritable bowel syndrome (IBS), thyroid disease, or depression.   Not drinking enough water.   Not eating enough fiber-rich foods.   Stress or travel.  Lack of physical activity or exercise.  Not going to the restroom when there is the urge to have a bowel movement.  Ignoring the urge to have a bowel movement.  Using laxatives too much. SYMPTOMS   Having fewer than 3 bowel movements a week.   Straining to have a bowel movement.   Having hard, dry, or larger than normal stools.   Feeling full or bloated.   Pain in the lower abdomen.  Not feeling relief after having a bowel movement. DIAGNOSIS  Your caregiver will take a medical history and perform a physical exam. Further testing may be done for severe constipation. Some tests may  include:   A barium enema X-ray to examine your rectum, colon, and sometimes, your small intestine.  A sigmoidoscopy to examine your lower colon.  A colonoscopy to examine your entire colon. TREATMENT  Treatment will depend on the severity of your constipation and what is causing it. Some dietary treatments include drinking more fluids and eating more fiber-rich foods. Lifestyle treatments may include regular exercise. If these diet and lifestyle recommendations do not help, your caregiver may recommend taking over-the-counter laxative medicines to help you have bowel movements. Prescription medicines may be prescribed if over-the-counter medicines do not work.  HOME CARE INSTRUCTIONS   Increase dietary fiber in your diet, such as fruits, vegetables, whole grains, and beans. Limit high-fat and processed sugars in your diet, such as [place] fries, hamburgers, cookies, candies, and soda.   A fiber supplement may be added to your diet if you cannot get enough fiber from foods.   Drink enough fluids to keep your urine clear or pale yellow.   Exercise regularly or as directed by your caregiver.   Go to the restroom when you have the urge to go. Do not hold it.  Only take medicines as directed by your caregiver. Do not take other medicines for constipation without talking to your caregiver first. SEEK IMMEDIATE MEDICAL CARE IF:   You have bright red blood in your stool.   Your constipation lasts for more than 4 days or  gets worse.   You have abdominal or rectal pain.   You have thin, pencil-like stools.  You have unexplained weight loss. MAKE SURE YOU:   Understand these instructions.  Will watch your condition.  Will get help right away if you are not doing well or get worse. Document Released: 06/04/2004 Document Revised: 11/29/2011 Document Reviewed: 08/10/2011 [facility] Patient Information 2014 [place], [place].

## 2013-08-29 NOTE — Assessment & Plan Note (Signed)
The patient confirmed that they are not allergic to eggs and have never had a bad reaction with the flu shot in the past.  The vaccination was given without difficulty.   

## 2013-08-29 NOTE — Progress Notes (Signed)
Subjective:    Patient ID: Brianna Zimmerman, female    DOB: 1971/08/21, 42 y.o.   MRN: [id number]  HPI  Brianna Zimmerman is here today to discuss a couple of issues:    1)  Anxiety: She needs a refill of her Prozac (40 mg, once daily).  Her mood is stable on this medication and she would like to continue it.    2)  Constipation: She has always struggled with constipation.  She occasionally goes every 5 days.  She feels that this problem has been worsening over the past few months.  She has tried several OTC and home remedies but nothing has helped her.     Review of Systems  Constitutional: Negative for activity change, fatigue and unexpected weight change.  HENT: Negative.   Eyes: Negative.   Respiratory: Negative for shortness of breath.   Cardiovascular: Negative for chest pain, palpitations and leg swelling.  Gastrointestinal: Positive for constipation. Negative for diarrhea.  Endocrine: Negative.   Genitourinary: Negative for difficulty urinating.  Musculoskeletal: Negative.   Skin: Negative.   Neurological: Negative.   Hematological: Negative for adenopathy. Does not bruise/bleed easily.  Psychiatric/Behavioral: Negative for sleep disturbance and dysphoric mood. The patient is not nervous/anxious.   All other systems reviewed and are negative.     Past Medical History  Diagnosis Date  . Anxiety   . Depression   . PCOS (polycystic ovarian syndrome)   . DVT of deep femoral vein   . Cervical dysplasia   . Unspecified vitamin D deficiency   . Impaired fasting glucose   . Other malaise and fatigue      Past Surgical History  Procedure Laterality Date  . Cholecystectomy    . Abdominal hysterectomy      Cervical Dysplasia      History   Social History Narrative   Marital Status: Married Midwife)    Children:  [name])    Pets: Dogs (2)    Living Situation: Lives with husband and son   Occupation: Higher education careers adviser Title)    Education: Engineer, agricultural;  Scientific laboratory technician   Tobacco Use/Exposure:  None    Alcohol Use:  White Wine (2 glasses per week)    Drug Use:  None    Diet:  Regular   Exercise:  None    Hobbies: Reading, Traveling              Family History  Problem Relation Age of Onset  . Cancer Father   . Cancer Paternal Uncle     Colon      Current Outpatient Prescriptions on File Prior to Visit  Medication Sig Dispense Refill  . ibuprofen (ADVIL,MOTRIN) 600 MG tablet Take 1 tablet (600 mg total) by mouth every 6 (six) hours as needed for pain.  20 tablet  0   No current facility-administered medications on file prior to visit.     Allergies  Allergen Reactions  . Tavist Hives     Immunization History  Administered Date(s) Administered  . Influenza,inj,Quad PF,36+ Mos 08/29/2013      Objective:   Physical Exam  Vitals reviewed. Constitutional: She is oriented to person, place, and time.  Eyes: Conjunctivae are normal. No scleral icterus.  Neck: Neck supple. No thyromegaly present.  Cardiovascular: Normal rate, regular rhythm and normal heart sounds.   Pulmonary/Chest: Effort normal and breath sounds normal.  Musculoskeletal: She exhibits no edema and no tenderness.  Lymphadenopathy:    She has no cervical adenopathy.  Neurological: She is alert and oriented to person, place, and time.  Skin: Skin is warm and dry.  Psychiatric: She has a normal mood and affect. Her behavior is normal. Judgment and thought content normal.      Assessment & Plan:    Brianna Zimmerman was seen today for anxiety and constipation.  Diagnoses and associated orders for this visit:  Anxiety state, unspecified - FLUoxetine (PROZAC) 40 MG capsule; Take 1 capsule (40 mg total) by mouth daily.  Unspecified constipation Comments: She was given a long list of things to try for her constipation.    Need for prophylactic vaccination and inoculation against influenza - Flu Vaccine QUAD 36+ mos PF IM (Fluarix)

## 2013-08-31 ENCOUNTER — Ambulatory Visit: Payer: Self-pay | Admitting: Obstetrics and Gynecology

## 2013-10-05 ENCOUNTER — Other Ambulatory Visit: Payer: Self-pay | Admitting: *Deleted

## 2013-10-05 DIAGNOSIS — Z0000 Encounter for general adult medical examination without abnormal findings: Secondary | ICD-10-CM

## 2013-10-08 ENCOUNTER — Other Ambulatory Visit (INDEPENDENT_AMBULATORY_CARE_PROVIDER_SITE_OTHER): Payer: BC Managed Care – PPO

## 2013-10-08 LAB — CBC WITH DIFFERENTIAL/PLATELET
Basophils Absolute: 0 10*3/uL (ref 0.0–0.1)
Basophils Relative: 0 % (ref 0–1)
Eosinophils Absolute: 0.2 10*3/uL (ref 0.0–0.7)
Eosinophils Relative: 3 % (ref 0–5)
HCT: 45 % (ref 36.0–46.0)
Hemoglobin: 15.4 g/dL — ABNORMAL HIGH (ref 12.0–15.0)
Lymphocytes Relative: 40 % (ref 12–46)
Lymphs Abs: 2.1 10*3/uL (ref 0.7–4.0)
MCH: 30.6 pg (ref 26.0–34.0)
MCHC: 34.2 g/dL (ref 30.0–36.0)
MCV: 89.5 fL (ref 78.0–100.0)
Monocytes Absolute: 0.5 10*3/uL (ref 0.1–1.0)
Monocytes Relative: 9 % (ref 3–12)
Neutro Abs: 2.6 10*3/uL (ref 1.7–7.7)
Neutrophils Relative %: 48 % (ref 43–77)
Platelets: 230 10*3/uL (ref 150–400)
RBC: 5.03 MIL/uL (ref 3.87–5.11)
RDW: 13.6 % (ref 11.5–15.5)
WBC: 5.3 10*3/uL (ref 4.0–10.5)

## 2013-10-08 LAB — LIPID PANEL
Cholesterol: 177 mg/dL (ref 0–200)
HDL: 58 mg/dL (ref >39)
LDL Cholesterol: 99 mg/dL (ref 0–99)
Total CHOL/HDL Ratio: 3.1 Ratio
Triglycerides: 101 mg/dL (ref <150)
VLDL: 20 mg/dL (ref 0–40)

## 2013-10-08 LAB — COMPLETE METABOLIC PANEL WITH GFR
ALT: 22 U/L (ref 0–35)
AST: 24 U/L (ref 0–37)
Albumin: 4.1 g/dL (ref 3.5–5.2)
Alkaline Phosphatase: 64 U/L (ref 39–117)
BUN: 10 mg/dL (ref 6–23)
CO2: 22 mEq/L (ref 19–32)
Calcium: 8.8 mg/dL (ref 8.4–10.5)
Chloride: 107 mEq/L (ref 96–112)
Creat: 0.84 mg/dL (ref 0.50–1.10)
GFR, Est African American: >89 mL/min
GFR, Est Non African American: 85 mL/min
Glucose, Bld: 110 mg/dL — ABNORMAL HIGH (ref 70–99)
Potassium: 4 mEq/L (ref 3.5–5.3)
Sodium: 138 mEq/L (ref 135–145)
Total Bilirubin: 1 mg/dL (ref 0.3–1.2)
Total Protein: 6.6 g/dL (ref 6.0–8.3)

## 2013-10-08 LAB — TSH: TSH: 1.23 u[IU]/mL (ref 0.350–4.500)

## 2013-10-11 ENCOUNTER — Other Ambulatory Visit: Payer: BC Managed Care – PPO

## 2013-10-16 ENCOUNTER — Other Ambulatory Visit: Payer: Self-pay

## 2013-10-16 DIAGNOSIS — Z1231 Encounter for screening mammogram for malignant neoplasm of breast: Secondary | ICD-10-CM

## 2013-10-18 ENCOUNTER — Ambulatory Visit: Payer: BC Managed Care – PPO | Admitting: Family Medicine

## 2013-10-18 ENCOUNTER — Encounter: Payer: Self-pay | Admitting: Family Medicine

## 2013-10-18 ENCOUNTER — Ambulatory Visit (INDEPENDENT_AMBULATORY_CARE_PROVIDER_SITE_OTHER): Payer: BC Managed Care – PPO | Admitting: Family Medicine

## 2013-10-18 VITALS — BP 135/92 | HR 62 | Resp 16 | Ht 68.5 in | Wt 224.0 lb

## 2013-10-18 DIAGNOSIS — R5383 Other fatigue: Secondary | ICD-10-CM

## 2013-10-18 DIAGNOSIS — R5381 Other malaise: Secondary | ICD-10-CM

## 2013-10-18 DIAGNOSIS — E282 Polycystic ovarian syndrome: Secondary | ICD-10-CM

## 2013-10-18 DIAGNOSIS — E669 Obesity, unspecified: Secondary | ICD-10-CM

## 2013-10-18 MED ORDER — METFORMIN HCL 500 MG PO TABS: 500.0000 mg | ORAL_TABLET | Freq: Two times a day (BID) | ORAL | Status: DC

## 2013-10-18 MED ORDER — BUPROPION HCL ER (XL) 150 MG PO TB24: 150.0000 mg | ORAL_TABLET | Freq: Every day | ORAL | Status: DC

## 2013-10-18 MED ORDER — SPIRONOLACTONE 50 MG PO TABS: 50.0000 mg | ORAL_TABLET | Freq: Two times a day (BID) | ORAL | Status: DC

## 2013-10-18 NOTE — Patient Instructions (Signed)
1)  Fatigue - Start on the Wellbutrin XL 150 in the am.  We can increase to 300 mg in a month if you are doing well.   2)  PCOS/Blood Sugar - Start on metformin 500 mg at bedtime you can increase to 1 pill twice a day if tolerated.  If this version give too much GI side effects (diarrhea) then we can try the XR version.    3)  Hair/Fluid/Acne - Spironolactone - 50 mg - Start with 1 in am and increase to 2 per day as tolerated.    4)  Weight - Decrease calories 500 cal per day to lose 3500 cal per week = 1 pound.       Exercise to Lose Weight Exercise and a healthy diet may help you lose weight. Your doctor may suggest specific exercises. EXERCISE IDEAS AND TIPS  Choose low-cost things you enjoy doing, such as walking, bicycling, or exercising to workout videos.  Take stairs instead of the elevator.  Walk during your lunch break.  Park your car further away from work or school.  Go to a gym or an exercise class.  Start with 5 to 10 minutes of exercise each day. Build up to 30 minutes of exercise 4 to 6 days a week.  Wear shoes with good support and comfortable clothes.  Stretch before and after working out.  Work out until you breathe harder and your heart beats faster.  Drink extra water when you exercise.  Do not do so much that you hurt yourself, feel dizzy, or get very short of breath. Exercises that burn about 150 calories:  Running 1  miles in 15 minutes.  Playing volleyball for 45 to 60 minutes.  Washing and waxing a car for 45 to 60 minutes.  Playing touch football for 45 minutes.  Walking 1  miles in 35 minutes.  Pushing a stroller 1  miles in 30 minutes.  Playing basketball for 30 minutes.  Raking leaves for 30 minutes.  Bicycling 5 miles in 30 minutes.  Walking 2 miles in 30 minutes.  Dancing for 30 minutes.  Shoveling snow for 15 minutes.  Swimming laps for 20 minutes.  Walking up stairs for 15 minutes.  Bicycling 4 miles in 15  minutes.  Gardening for 30 to 45 minutes.  Jumping rope for 15 minutes.  Washing windows or floors for 45 to 60 minutes. Document Released: 10/09/2010 Document Revised: 11/29/2011 Document Reviewed: 10/09/2010 [facility] Patient Information 2014 [place], [place].

## 2013-10-18 NOTE — Progress Notes (Signed)
Subjective:    Patient ID: Brianna Zimmerman, female    DOB: [birth year], 43 y.o.   MRN: [id number]  HPI  Brianna Zimmerman is here today to go over her most recent lab results.  She also wants to discuss the conditions listed below:  1)  Mood - She feels that her mood is stable on Prozac.  She would like to continue on it.    2)  Obesity - She is very frustated with her weight.  She has been working out, dieting and staying away from sweets and sodas but she does not lose much weight.    3)  Abdominal Pain:  She has been having upper abdominal pain for the past two weeks.  She describes this pain as "cramps."  She has taking Aleve occasionally which has helped her with her pain.     Review of Systems  Constitutional: Positive for fatigue.  Gastrointestinal: Positive for abdominal pain.  Psychiatric/Behavioral: The patient is not nervous/anxious.   All other systems reviewed and are negative.      Past Medical History  Diagnosis Date  . Anxiety   . Depression   . PCOS (polycystic ovarian syndrome)   . DVT of deep femoral vein   . Cervical dysplasia   . Unspecified vitamin D deficiency   . Impaired fasting glucose   . Other malaise and fatigue      Past Surgical History  Procedure Laterality Date  . Cholecystectomy    . 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 Cancer Father   . Cancer Paternal Uncle     Colon      Current Outpatient Prescriptions on File Prior to  Visit  Medication Sig Dispense Refill  . FLUoxetine (PROZAC) 40 MG capsule Take 1 capsule (40 mg total) by mouth daily.  90 capsule  1  . ibuprofen (ADVIL,MOTRIN) 600 MG tablet Take 1 tablet (600 mg total) by mouth every 6 (six) hours as needed for pain.  20 tablet  0   No current facility-administered medications on file prior to visit.     Allergies  Allergen Reactions  . Tavist Hives     Immunization History  Administered Date(s) Administered  . Influenza,inj,Quad PF,36+ Mos 08/29/2013        Objective:   Physical Exam  Vitals reviewed. Constitutional: She is oriented to person, place, and time.  Eyes: Conjunctivae are normal. No scleral icterus.  Neck: Neck supple. No thyromegaly present.  Cardiovascular: Normal rate, regular rhythm and normal heart sounds.   Pulmonary/Chest: Effort normal and breath sounds normal.  Musculoskeletal: She exhibits no edema and no tenderness.  Lymphadenopathy:    She has no cervical adenopathy.  Neurological: She is alert and oriented to person, place, and time.  Skin: Skin is warm and  dry.  Psychiatric: She has a normal mood and affect. Her behavior is normal. Judgment and thought content normal.      Assessment & Plan:    Shaquisha was seen today for medication management.  Diagnoses and associated orders for this visit:  PCOS (polycystic ovarian syndrome) Comments: Starting on metformin & spironolactone.   - metFORMIN (GLUCOPHAGE) 500 MG tablet; Take 1 tablet (500 mg total) by mouth 2 (two) times daily with a meal. - spironolactone (ALDACTONE) 50 MG tablet; Take 1 tablet (50 mg total) by mouth 2 (two) times daily.  Other malaise and fatigue Comments: Start on the Wellbutrin XL 150 in the am.  We can increase to 300 mg in a month if you are doing well.   - buPROPion (WELLBUTRIN XL) 150 MG 24 hr tablet; Take 1 tablet (150 mg total) by mouth daily.  Obesity, unspecified Comments: Decrease calories 500 cal per day to lose 3500  cal per week = 1 pound.     TIME SPENT "FACE TO FACE" WITH PATIENT - 30 MINS

## 2013-11-12 ENCOUNTER — Ambulatory Visit
Admission: RE | Admit: 2013-11-12 | Discharge: 2013-11-12 | Disposition: A | Discharge status: Home / Self Care | Payer: Self-pay | Source: Ambulatory Visit

## 2013-11-12 DIAGNOSIS — Z1231 Encounter for screening mammogram for malignant neoplasm of breast: Secondary | ICD-10-CM

## 2013-11-26 ENCOUNTER — Ambulatory Visit: Payer: BC Managed Care – PPO | Admitting: Physician Assistant

## 2013-11-26 VITALS — BP 136/90 | HR 66 | Temp 98.1°F | Resp 18 | Ht 69.0 in | Wt 222.6 lb

## 2013-11-26 DIAGNOSIS — R509 Fever, unspecified: Secondary | ICD-10-CM

## 2013-11-26 DIAGNOSIS — R059 Cough, unspecified: Secondary | ICD-10-CM

## 2013-11-26 DIAGNOSIS — R05 Cough: Secondary | ICD-10-CM

## 2013-11-26 MED ORDER — HYDROCODONE-HOMATROPINE 5-1.5 MG/5ML PO SYRP: 5.0000 mL | ORAL_SOLUTION | Freq: Three times a day (TID) | ORAL | Status: DC | PRN

## 2013-11-26 MED ORDER — AZITHROMYCIN 250 MG PO TABS: ORAL_TABLET | ORAL | Status: DC

## 2013-11-26 MED ORDER — BENZONATATE 100 MG PO CAPS: 100.0000 mg | ORAL_CAPSULE | Freq: Three times a day (TID) | ORAL | Status: DC | PRN

## 2013-11-26 NOTE — Patient Instructions (Signed)
Take the azithromycin (antibiotic) as directed.  Be sure to finish the full course.  Use the Tessalon Perles every 8 hours as needed for cough  Hycodan syrup at bedtime for cough - may make you sleepy so be careful with the first dose  Plenty of fluids (water is best!)   If anything is worsening or not improving, please let us know   Bronchitis Bronchitis is inflammation of the airways that extend from the windpipe into the lungs (bronchi). The inflammation often causes mucus to develop, which leads to a cough. If the inflammation becomes severe, it may cause shortness of breath. CAUSES  Bronchitis may be caused by:   Viral infections.   Bacteria.   Cigarette smoke.   Allergens, pollutants, and other irritants.  SIGNS AND SYMPTOMS  The most common symptom of bronchitis is a frequent cough that produces mucus. Other symptoms include:  Fever.   Body aches.   Chest congestion.   Chills.   Shortness of breath.   Sore throat.  DIAGNOSIS  Bronchitis is usually diagnosed through a medical history and physical exam. Tests, such as chest X-rays, are sometimes done to rule out other conditions.  TREATMENT  You may need to avoid contact with whatever caused the problem (smoking, for example). Medicines are sometimes needed. These may include:  Antibiotics. These may be prescribed if the condition is caused by bacteria.  Cough suppressants. These may be prescribed for relief of cough symptoms.   Inhaled medicines. These may be prescribed to help open your airways and make it easier for you to breathe.   Steroid medicines. These may be prescribed for those with recurrent (chronic) bronchitis. HOME CARE INSTRUCTIONS  Get plenty of rest.   Drink enough fluids to keep your urine clear or pale yellow (unless you have a medical condition that requires fluid restriction). Increasing fluids may help thin your secretions and will prevent dehydration.   Only take  over-the-counter or prescription medicines as directed by your health care provider.  Only take antibiotics as directed. Make sure you finish them even if you start to feel better.  Avoid secondhand smoke, irritating chemicals, and strong fumes. These will make bronchitis worse. If you are a smoker, quit smoking. Consider using nicotine gum or skin patches to help control withdrawal symptoms. Quitting smoking will help your lungs heal faster.   Put a cool-mist humidifier in your bedroom at night to moisten the air. This may help loosen mucus. Change the water in the humidifier daily. You can also run the hot water in your shower and sit in the bathroom with the door closed for 5 10 minutes.   Follow up with your health care provider as directed.   Wash your hands frequently to avoid catching bronchitis again or spreading an infection to others.  SEEK MEDICAL CARE IF: Your symptoms do not improve after 1 week of treatment.  SEEK IMMEDIATE MEDICAL CARE IF:  Your fever increases.  You have chills.   You have chest pain.   You have worsening shortness of breath.   You have bloody sputum.  You faint.  You have lightheadedness.  You have a severe headache.   You vomit repeatedly. MAKE SURE YOU:   Understand these instructions.  Will watch your condition.  Will get help right away if you are not doing well or get worse. Document Released: 09/06/2005 Document Revised: 06/27/2013 Document Reviewed: 05/01/2013 [facility] Patient Information 2014 [place], [place].

## 2013-11-26 NOTE — Progress Notes (Signed)
   Subjective:    Patient ID: Brianna Zimmerman, female    DOB: 1971-08-28, 43 y.o.   MRN: [id number]  HPI    [name] is a very pleasant 43 yr old female here with concern for illness.  Reports symptom onset about 10 days ago.  Started with a mild cough and sore throat, had some nasal drainage.  Nasal symptoms have resolved but cough persists.  Her chest feels tight and sore from coughing so much.  The cough was productive over the last couple of days - "chunky, greenish, red, brown."  She has had low grade fevers, tmax 100-101F.  No history of asthma.  Non smoker.  Cough is exhausting, keeps awake at night.  Has taken sudafed, nyquil, alkaseltzer without completed relief of symptoms.      Review of Systems  Constitutional: Positive for fever and fatigue. Negative for chills.  HENT: Positive for sore throat. Negative for congestion, ear pain and rhinorrhea.   Respiratory: Positive for cough. Negative for shortness of breath and wheezing.   Cardiovascular: Negative.   Gastrointestinal: Negative.   Musculoskeletal: Negative.   Skin: Negative.   Neurological: Positive for headaches (from coughing).       Objective:   Physical Exam  Vitals reviewed. Constitutional: She is oriented to person, place, and time. She appears well-developed and well-nourished. No distress.  HENT:  Head: Normocephalic and atraumatic.  Right Ear: Tympanic membrane and ear canal normal.  Left Ear: Tympanic membrane and ear canal normal.  Mouth/Throat: Uvula is midline and mucous membranes are normal. Posterior oropharyngeal erythema present. No oropharyngeal exudate or posterior oropharyngeal edema.  Eyes: Conjunctivae are normal. No scleral icterus.  Neck: Neck supple.  Cardiovascular: Normal rate, regular rhythm and normal heart sounds.   Pulmonary/Chest: Effort normal and breath sounds normal. She has no wheezes. She has no rales.  Abdominal: Soft. There is no tenderness.  Lymphadenopathy:    She has no  cervical adenopathy.  Neurological: She is alert and oriented to person, place, and time.  Skin: Skin is warm and dry.  Psychiatric: She has a normal mood and affect. Her behavior is normal.       Assessment & Plan:  Cough - Plan: benzonatate (TESSALON) 100 MG capsule, HYDROcodone-homatropine (HYCODAN) 5-1.5 MG/5ML syrup, azithromycin (ZITHROMAX) 250 MG tablet  Fever, unspecified - Plan: azithromycin (ZITHROMAX) 250 MG tablet   [name] is a very pleasant 43 yr old female here with 10 days of URI symptoms now with persistent productive cough and low grade fevers.  Vital signs are WNL, she is afebrile today.  Lungs are CTA bilaterally - there are no wheezes or rhonchi.  Given clinical history, will treat with azithro to cover atypicals/bronchitis.  Tessalon and Hycodan for cough.  Push fluids, rest.  Pt to call or RTC if worsening or not improving  E. Frances FurbishElizabeth Aven Zimmerman MHS, PA-C [facility] 3/9/20153:35 PM

## 2013-12-21 DIAGNOSIS — E282 Polycystic ovarian syndrome: Secondary | ICD-10-CM | POA: Insufficient documentation

## 2013-12-21 DIAGNOSIS — E669 Obesity, unspecified: Secondary | ICD-10-CM | POA: Insufficient documentation

## 2014-02-20 ENCOUNTER — Ambulatory Visit: Payer: Self-pay | Admitting: Obstetrics and Gynecology

## 2014-03-06 ENCOUNTER — Other Ambulatory Visit: Payer: Self-pay | Admitting: Family Medicine

## 2014-03-11 ENCOUNTER — Other Ambulatory Visit: Payer: Self-pay | Admitting: *Deleted

## 2014-03-11 DIAGNOSIS — F411 Generalized anxiety disorder: Secondary | ICD-10-CM

## 2014-03-11 MED ORDER — FLUOXETINE HCL 40 MG PO CAPS: 40.0000 mg | ORAL_CAPSULE | Freq: Every day | ORAL | Status: DC

## 2014-03-11 NOTE — Telephone Encounter (Signed)
Brianna Zimmerman called about a refill on her Prozac, I went ahead and made her an appointment for labs and med refill follow up in July. Can she get one refill until she can come in?

## 2014-03-18 ENCOUNTER — Ambulatory Visit (INDEPENDENT_AMBULATORY_CARE_PROVIDER_SITE_OTHER): Payer: BC Managed Care – PPO | Admitting: Obstetrics and Gynecology

## 2014-03-18 ENCOUNTER — Encounter: Payer: Self-pay | Admitting: Obstetrics and Gynecology

## 2014-03-18 VITALS — BP 120/84 | HR 84 | Resp 18 | Ht 68.75 in | Wt 227.8 lb

## 2014-03-18 DIAGNOSIS — L680 Hirsutism: Secondary | ICD-10-CM

## 2014-03-18 DIAGNOSIS — Z01419 Encounter for gynecological examination (general) (routine) without abnormal findings: Secondary | ICD-10-CM

## 2014-03-18 NOTE — Patient Instructions (Signed)

## 2014-03-18 NOTE — Progress Notes (Signed)
Patient ID: Brianna Zimmerman, female   DOB: [birth year], 43 y.o.   MRN: [id number] GYNECOLOGY VISIT  PCP:  Birdena Jubileeobyn Zanard, MD  Referring provider:   HPI: 43 y.o.   Married  Caucasian  female   G1P1001 with Patient's last menstrual period was 09/20/1992.   here for   AEX.  Taking Metformin and spironolactone for PCOS. Has been on these over the years off and on.  Biggest issue for the patient is hair growth on her face.  Shaves and plucks.  Uses makeup to cover it.  States she has more hair growth than her husband.  Has not used Vaniq. Has already done laser hair removal and it has not worked.  Has hair loss on top of head.  Difficulty with weight loss.   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   Taking calcium 600 mg daily and vit D 2000 IU daily.   Hgb:   PCP Urine:  Unable to void  GYNECOLOGIC HISTORY: Patient's last menstrual period was 09/20/1992. 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 Cancer Father   . Cancer Paternal Uncle     Colon     Patient Active Problem List   Diagnosis Date Noted   . PCOS (polycystic ovarian syndrome) 12/21/2013  . Obesity, unspecified 12/21/2013  . Need for prophylactic vaccination and inoculation against influenza 08/29/2013  . Other malaise and fatigue 04/26/2013  . Cough 04/26/2013  . Depression 12/31/2012  . Anxiety state, unspecified 12/31/2012  . Unspecified constipation 12/31/2012   Past Medical History  Diagnosis Date  . Anxiety   . Depression   . PCOS (polycystic ovarian syndrome)   . DVT of deep femoral vein   . Cervical dysplasia   . Unspecified vitamin D deficiency   . Impaired fasting glucose   . Other malaise and fatigue     Past Surgical History  Procedure Laterality Date  . Cholecystectomy    . Abdominal hysterectomy      TVH-still has oaries--Cervical Dysplasia     ALLERGIES: Tavist  Current Outpatient Prescriptions  Medication Sig Dispense Refill  . FLUoxetine (PROZAC) 40 MG capsule Take 1 capsule (40 mg total) by mouth daily.  30 capsule  0  . ibuprofen (ADVIL,MOTRIN) 600 MG tablet Take 1 tablet (600 mg total) by mouth every 6 (six) hours as needed for pain.  20 tablet  0   No current facility-administered medications for this visit.     ROS:  Pertinent items are noted in HPI.  SOCIAL HISTORY:  Married.  Underwriter.   PHYSICAL EXAMINATION:    BP 120/84  Pulse 84  Resp 18  Ht 5' 8.75" (1.746 m)  Wt 227 lb 12.8 oz (103.329 kg)  BMI 33.89 kg/m2  LMP 09/20/1992   Wt Readings from Last 3 Encounters:  03/18/14 227 lb 12.8 oz (103.329 kg)  11/26/13 222 lb 9.6 oz (100.971 kg)  10/18/13 224 lb (101.606 kg)     Ht Readings from Last 3 Encounters:  03/18/14 5' 8.75" (1.746 m)  11/26/13 5\' 9"  (1.753 m)  10/18/13 5' 8.5" (1.74 m)    General appearance: alert, cooperative and appears stated age Head: Normocephalic, without obvious abnormality, atraumatic Neck: no adenopathy, supple, symmetrical, trachea midline and thyroid not enlarged, symmetric, no tenderness/mass/nodules Lungs: clear to auscultation  bilaterally Breasts: Inspection negative, No nipple retraction or dimpling, No nipple discharge or bleeding, No axillary or supraclavicular adenopathy, Normal to palpation without dominant masses Heart: regular rate and rhythm Abdomen: soft, non-tender; no masses,  no organomegaly Extremities: extremities normal, atraumatic, no cyanosis or edema Skin: Significant facial hair, normal skin color, texture, turgor normal. No rashes or lesions Lymph nodes: Cervical, supraclavicular, and axillary nodes normal. No abnormal inguinal nodes palpated Neurologic: Grossly normal  Pelvic: External genitalia:  no lesions              Urethra:  normal appearing urethra with no masses, tenderness or lesions              Bartholins and Skenes: normal                 Vagina: normal appearing vagina with normal color and discharge, no lesions              Cervix:  absent              Pap and high risk HPV testing done: Yes.  .            Bimanual Exam:  Uterus:   absent                                      Adnexa: normal adnexa in size, nontender and no masses                                      Rectovaginal: Confirms                                      Anus:  normal sphincter tone, no lesions  ASSESSMENT  Normal gynecologic exam. Status post TVH for CIN III.  Hirsutism. History of DVT on combined OCPs.   PLAN  Mammogram recommended yearly.  Pap smear and high risk HPV testing performed.  Counseled on self breast exam, Calcium and vitamin D intake, exercise, and weight loss.  Will check prolactin, 17 OH-P, DHEA - S, testosterone panel.  Does routine labs with her PCP. Return annually or prn  An After Visit Summary was printed and given to the patient.

## 2014-03-19 LAB — TESTOSTERONE, FREE: Testosterone, Free: 6.2 pg/mL (ref 0.6–6.8)

## 2014-03-19 LAB — PROLACTIN: PROLACTIN: 22.4 ng/mL

## 2014-03-19 LAB — DHEA-SULFATE: DHEA-SO4: 165 ug/dL (ref 35–430)

## 2014-03-19 LAB — TESTOSTERONE, % FREE: Testosterone-% Free: 1.4 % (ref 0.4–2.4)

## 2014-03-19 LAB — SEX HORMONE BINDING GLOBULIN: SEX HORMONE BINDING: 49 nmol/L (ref 18–114)

## 2014-03-19 LAB — TESTOSTERONE: TESTOSTERONE: 44 ng/dL (ref 10–70)

## 2014-03-20 ENCOUNTER — Other Ambulatory Visit: Payer: BC Managed Care – PPO

## 2014-03-22 LAB — 17-HYDROXYPROGESTERONE: 17-OH-Progesterone, LC/MS/MS: 69 ng/dL

## 2014-03-25 LAB — IPS PAP TEST WITH HPV

## 2014-04-04 ENCOUNTER — Ambulatory Visit (INDEPENDENT_AMBULATORY_CARE_PROVIDER_SITE_OTHER): Payer: BC Managed Care – PPO | Admitting: Family Medicine

## 2014-04-04 ENCOUNTER — Encounter: Payer: Self-pay | Admitting: Family Medicine

## 2014-04-04 VITALS — BP 123/86 | HR 60 | Resp 16 | Ht 68.5 in | Wt 228.0 lb

## 2014-04-04 DIAGNOSIS — R41840 Attention and concentration deficit: Secondary | ICD-10-CM

## 2014-04-04 DIAGNOSIS — F411 Generalized anxiety disorder: Secondary | ICD-10-CM

## 2014-04-04 MED ORDER — FLUOXETINE HCL 40 MG PO CAPS: 40.0000 mg | ORAL_CAPSULE | Freq: Every day | ORAL | Status: DC

## 2014-04-04 MED ORDER — LISDEXAMFETAMINE DIMESYLATE 50 MG PO CAPS: 50.0000 mg | ORAL_CAPSULE | Freq: Every day | ORAL | Status: DC

## 2014-04-04 NOTE — Progress Notes (Signed)
Subjective:    Patient ID: Brianna Zimmerman, female    DOB: 06/01/1971, 43 y.o.   MRN: [id number]  HPI  Brianna Zimmerman is here today to follow up on her medication. She is taking Prozac 40 mg daily. She feels that it is still working but it's not as effective as it used to be. She thinks that maybe increasing the dosage would help.     Review of Systems  Constitutional: Negative for activity change, appetite change and fatigue.  Cardiovascular: Negative for chest pain, palpitations and leg swelling.  Psychiatric/Behavioral: Positive for decreased concentration. Negative for behavioral problems. The patient is not nervous/anxious.   All other systems reviewed and are negative.    Past Medical History  Diagnosis Date  . Anxiety   . Depression   . PCOS (polycystic ovarian syndrome)   . DVT of deep femoral vein   . Cervical dysplasia   . Unspecified vitamin D deficiency   . Impaired fasting glucose   . Other malaise and fatigue      Past Surgical History  Procedure Laterality Date  . Cholecystectomy    . Abdominal hysterectomy      TVH-still has oaries--Cervical Dysplasia      History   Social History Narrative   Marital Status: Married Midwife)    Children:  Son [name])    Pets: Dogs (2)    Living Situation: Lives with husband and son   Occupation: Higher education careers adviser Title)    Education: Engineer, agricultural; Scientific laboratory technician   Tobacco Use/Exposure:  None    Alcohol Use:  White Wine (2 glasses per week)    Drug Use:  None    Diet:  Regular   Exercise:  None    Hobbies: Reading, Traveling              Family History  Problem Relation Age of Onset  . Cancer Father   . Cancer Paternal Uncle     Colon      Current Outpatient Prescriptions on File Prior to Visit  Medication Sig Dispense Refill  . FLUoxetine (PROZAC) 40 MG capsule Take 1 capsule (40 mg total) by mouth daily.  30 capsule  0  . ibuprofen (ADVIL,MOTRIN) 600 MG tablet Take 1 tablet (600 mg  total) by mouth every 6 (six) hours as needed for pain.  20 tablet  0   No current facility-administered medications on file prior to visit.     Allergies  Allergen Reactions  . Tavist Hives     Immunization History  Administered Date(s) Administered  . Influenza,inj,Quad PF,36+ Mos 08/29/2013         Objective:   Physical Exam  Vitals reviewed. Constitutional: She is oriented to person, place, and time. She appears well-nourished. No distress.  Eyes: Conjunctivae are normal. No scleral icterus.  Neck: Neck supple. No thyromegaly present.  Cardiovascular: Normal rate, regular rhythm and normal heart sounds.   Pulmonary/Chest: Effort normal and breath sounds normal.  Musculoskeletal: She exhibits no edema and no tenderness.  Lymphadenopathy:    She has no cervical adenopathy.  Neurological: She is alert and oriented to person, place, and time.  Skin: Skin is warm and dry.  Psychiatric: She has a normal mood and affect. Her behavior is normal. Judgment and thought content normal.       Assessment & Plan:    Brianna Zimmerman was seen today for medication management.  Diagnoses and associated orders for this visit:  Anxiety state, unspecified - FLUoxetine (PROZAC)  40 MG capsule; Take 1 capsule (40 mg total) by mouth daily.  Concentration deficit Comments: She completed the adult ADD form and scored mostly 2/3s.  She is going to try Vyvanse for a month.   -     lisdexamfetamine (VYVANSE) 50 MG capsule; Take 1 capsule (50 mg total) by mouth daily.

## 2014-04-25 ENCOUNTER — Ambulatory Visit (INDEPENDENT_AMBULATORY_CARE_PROVIDER_SITE_OTHER): Payer: BC Managed Care – PPO | Admitting: Family Medicine

## 2014-04-25 ENCOUNTER — Encounter: Payer: Self-pay | Admitting: Family Medicine

## 2014-04-25 VITALS — BP 148/96 | HR 66 | Resp 16 | Ht 68.5 in | Wt 224.0 lb

## 2014-04-25 DIAGNOSIS — F411 Generalized anxiety disorder: Secondary | ICD-10-CM

## 2014-04-25 DIAGNOSIS — R41840 Attention and concentration deficit: Secondary | ICD-10-CM

## 2014-04-25 DIAGNOSIS — I10 Essential (primary) hypertension: Secondary | ICD-10-CM

## 2014-04-25 MED ORDER — LISDEXAMFETAMINE DIMESYLATE 50 MG PO CAPS: 50.0000 mg | ORAL_CAPSULE | Freq: Every day | ORAL | Status: DC

## 2014-04-25 MED ORDER — TRIAMTERENE-HCTZ 37.5-25 MG PO TABS: 1.0000 | ORAL_TABLET | Freq: Every day | ORAL | Status: DC

## 2014-04-25 MED ORDER — FLUOXETINE HCL 40 MG PO CAPS: 40.0000 mg | ORAL_CAPSULE | Freq: Every day | ORAL | Status: DC

## 2014-04-25 MED ORDER — ATOMOXETINE HCL 60 MG PO CAPS: 60.0000 mg | ORAL_CAPSULE | Freq: Every day | ORAL | Status: DC

## 2014-04-25 NOTE — Progress Notes (Signed)
Subjective:    Patient ID: Brianna Zimmerman, female    DOB: [birth year], 43 y.o.   MRN: [id number]  HPI  Brianna Zimmerman is here today to follow up on her concentration.  She has been taking Vyvanse for the past 3 weeks.  She feels that the Vyvanse is really helping her and she would like to remain on it.    Review of Systems  Constitutional: Negative for activity change, appetite change and fatigue.  Cardiovascular: Negative for chest pain, palpitations and leg swelling.  Psychiatric/Behavioral: Negative for behavioral problems and decreased concentration. The patient is not nervous/anxious.   All other systems reviewed and are negative.    Past Medical History  Diagnosis Date  . Anxiety   . Depression   . PCOS (polycystic ovarian syndrome)   . DVT of deep femoral vein   . Cervical dysplasia   . Unspecified vitamin D deficiency   . Impaired fasting glucose   . Other malaise and fatigue      Past Surgical History  Procedure Laterality Date  . Cholecystectomy    . Abdominal hysterectomy      TVH-still has oaries--Cervical Dysplasia      History   Social History Narrative   Marital Status: Married Midwife)    Children:  [name])    Pets: Dogs (2)    Living Situation: Lives with husband and son   Occupation: Higher education careers adviser Title)    Education: Engineer, agricultural; Scientific laboratory technician   Tobacco Use/Exposure:  None    Alcohol Use:  White Wine (2 glasses per week)    Drug Use:  None    Diet:  Regular   Exercise:  None    Hobbies: Reading, Traveling              Family History  Problem Relation Age of Onset  . Cancer Father   . Cancer Paternal Uncle     Colon      Current Outpatient Prescriptions on File Prior to Visit  Medication Sig Dispense Refill  . ibuprofen (ADVIL,MOTRIN) 600 MG tablet Take 1 tablet (600 mg total) by mouth every 6 (six) hours as needed for pain.  20 tablet  0   No current facility-administered medications on file prior to  visit.     Allergies  Allergen Reactions  . Tavist Hives     Immunization History  Administered Date(s) Administered  . Influenza,inj,Quad PF,36+ Mos 08/29/2013       Objective:   Physical Exam  Vitals reviewed. Constitutional: She is oriented to person, place, and time. She appears well-nourished. No distress.  Eyes: Conjunctivae are normal. No scleral icterus.  Neck: Neck supple. No thyromegaly present.  Cardiovascular: Normal rate, regular rhythm and normal heart sounds.   Pulmonary/Chest: Effort normal and breath sounds normal.  Musculoskeletal: She exhibits no edema and no tenderness.  Lymphadenopathy:    She has no cervical adenopathy.  Neurological: She is alert and oriented to person, place, and time.  Skin: Skin is warm and dry.  Psychiatric: She has a normal mood and affect. Her behavior is normal. Judgment and thought content normal.      Assessment & Plan:    [name] was seen today for medication management.  Diagnoses and associated orders for this visit:  Concentration deficit - lisdexamfetamine (VYVANSE) 50 MG capsule; Take 1 capsule (50 mg total) by mouth daily. Refills x 4 months  - atomoxetine (STRATTERA) 60 MG capsule; Take 1 capsule (60 mg total) by mouth  daily.  Essential hypertension, benign - triamterene-hydrochlorothiazide (MAXZIDE-25) 37.5-25 MG per tablet; Take 1 tablet by mouth daily.  Anxiety state, unspecified - FLUoxetine (PROZAC) 40 MG capsule; Take 1 capsule (40 mg total) by mouth daily.

## 2014-04-25 NOTE — Patient Instructions (Signed)
1)  Blood Pressure - Decrease your sodium intake and take the Maxzide each morning.    2)  Concentration - Continue on the Vyvanse 50 mg.  If you decide you want to try the Strattera do it and consider getting it at [facility].   DASH Eating Plan DASH stands for "Dietary Approaches to Stop Hypertension." The DASH eating plan is a healthy eating plan that has been shown to reduce high blood pressure (hypertension). Additional health benefits may include reducing the risk of type 2 diabetes mellitus, heart disease, and stroke. The DASH eating plan may also help with weight loss. WHAT DO I NEED TO KNOW ABOUT THE DASH EATING PLAN? For the DASH eating plan, you will follow these general guidelines:  Choose foods with a percent daily value for sodium of less than 5% (as listed on the food label).  Use salt-free seasonings or herbs instead of table salt or sea salt.  Check with your health care provider or pharmacist before using salt substitutes.  Eat lower-sodium products, often labeled as "lower sodium" or "no salt added."  Eat fresh foods.  Eat more vegetables, fruits, and low-fat dairy products.  Choose whole grains. Look for the word "whole" as the first word in the ingredient list.  Choose fish and skinless chicken or [place] more often than red meat. Limit fish, poultry, and meat to 6 oz (170 g) each day.  Limit sweets, desserts, sugars, and sugary drinks.  Choose heart-healthy fats.  Limit cheese to 1 oz (28 g) per day.  Eat more home-cooked food and less restaurant, buffet, and fast food.  Limit fried foods.  Cook foods using methods other than frying.  Limit canned vegetables. If you do use them, rinse them well to decrease the sodium.  When eating at a restaurant, ask that your food be prepared with less salt, or no salt if possible. WHAT FOODS CAN I EAT? Seek help from a dietitian for individual calorie needs. Grains Whole grain or whole wheat bread. Brown  rice. Whole grain or whole wheat pasta. Quinoa, bulgur, and whole grain cereals. Low-sodium cereals. Corn or whole wheat flour tortillas. Whole grain cornbread. Whole grain crackers. Low-sodium crackers. Vegetables Fresh or frozen vegetables (raw, steamed, roasted, or grilled). Low-sodium or reduced-sodium tomato and vegetable juices. Low-sodium or reduced-sodium tomato sauce and paste. Low-sodium or reduced-sodium canned vegetables.  Fruits All fresh, canned (in natural juice), or frozen fruits. Meat and Other Protein Products Ground beef (85% or leaner), grass-fed beef, or beef trimmed of fat. Skinless chicken or [place]. Ground chicken or [place]. Pork trimmed of fat. All fish and seafood. Eggs. Dried beans, peas, or lentils. Unsalted nuts and seeds. Unsalted canned beans. Dairy Low-fat dairy products, such as skim or 1% milk, 2% or reduced-fat cheeses, low-fat ricotta or cottage cheese, or plain low-fat yogurt. Low-sodium or reduced-sodium cheeses. Fats and Oils Tub margarines without trans fats. Light or reduced-fat mayonnaise and salad dressings (reduced sodium). Avocado. Safflower, olive, or canola oils. Natural peanut or almond butter. Other Unsalted popcorn and pretzels. The items listed above may not be a complete list of recommended foods or beverages. Contact your dietitian for more options. WHAT FOODS ARE NOT RECOMMENDED? Grains White bread. White pasta. White rice. Refined cornbread. Bagels and croissants. Crackers that contain trans fat. Vegetables Creamed or fried vegetables. Vegetables in a cheese sauce. Regular canned vegetables. Regular canned tomato sauce and paste. Regular tomato and vegetable juices. Fruits Dried fruits. Canned fruit in light or heavy syrup.  Fruit juice. Meat and Other Protein Products Fatty cuts of meat. Ribs, chicken wings, bacon, sausage, bologna, salami, chitterlings, fatback, hot dogs, bratwurst, and packaged luncheon meats. Salted nuts and seeds.  Canned beans with salt. Dairy Whole or 2% milk, cream, half-and-half, and cream cheese. Whole-fat or sweetened yogurt. Full-fat cheeses or blue cheese. Nondairy creamers and whipped toppings. Processed cheese, cheese spreads, or cheese curds. Condiments Onion and garlic salt, seasoned salt, table salt, and sea salt. Canned and packaged gravies. Worcestershire sauce. Tartar sauce. Barbecue sauce. Teriyaki sauce. Soy sauce, including reduced sodium. Steak sauce. Fish sauce. Oyster sauce. Cocktail sauce. Horseradish. Ketchup and mustard. Meat flavorings and tenderizers. Bouillon cubes. Hot sauce. Tabasco sauce. Marinades. Taco seasonings. Relishes. Fats and Oils Butter, stick margarine, lard, shortening, ghee, and bacon fat. Coconut, palm kernel, or palm oils. Regular salad dressings. Other Pickles and olives. Salted popcorn and pretzels. The items listed above may not be a complete list of foods and beverages to avoid. Contact your dietitian for more information. WHERE CAN I FIND MORE INFORMATION? National Heart, Lung, and Blood Institute: CablePromo.itwww.nhlbi.nih.gov/health/health-topics/topics/dash/ Document Released: 08/26/2011 Document Revised: 01/21/2014 Document Reviewed: 07/11/2013 [facility] Patient Information 2015 [place], [place]. This information is not intended to replace advice given to you by your health care provider. Make sure you discuss any questions you have with your health care provider.  Low-Sodium Eating Plan Sodium raises blood pressure and causes water to be held in the body. Getting less sodium from food will help lower your blood pressure, reduce any swelling, and protect your heart, liver, and kidneys. We get sodium by adding salt (sodium chloride) to food. Most of our sodium comes from canned, boxed, and frozen foods. Restaurant foods, fast foods, and pizza are also very high in sodium. Even if you take medicine to lower your blood pressure or to reduce fluid in your body, getting less  sodium from your food is important. WHAT IS MY PLAN? Most people should limit their sodium intake to 2,300 mg a day. Your health care provider recommends that you limit your sodium intake to __________ a day.  WHAT DO I NEED TO KNOW ABOUT THIS EATING PLAN? For the low-sodium eating plan, you will follow these general guidelines:  Choose foods with a % Daily Value for sodium of less than 5% (as listed on the food label).   Use salt-free seasonings or herbs instead of table salt or sea salt.   Check with your health care provider or pharmacist before using salt substitutes.   Eat fresh foods.  Eat more vegetables and fruits.  Limit canned vegetables. If you do use them, rinse them well to decrease the sodium.   Limit cheese to 1 oz (28 g) per day.   Eat lower-sodium products, often labeled as "lower sodium" or "no salt added."  Avoid foods that contain monosodium glutamate (MSG). MSG is sometimes added to [place] food and some canned foods.  Check food labels (Nutrition Facts labels) on foods to learn how much sodium is in one serving.  Eat more home-cooked food and less restaurant, buffet, and fast food.  When eating at a restaurant, ask that your food be prepared with less salt or none, if possible.  HOW DO I READ FOOD LABELS FOR SODIUM INFORMATION? The Nutrition Facts label lists the amount of sodium in one serving of the food. If you eat more than one serving, you must multiply the listed amount of sodium by the number of servings. Food labels may also identify foods as:  Sodium free--Less than 5 mg in a serving.  Very low sodium--35 mg or less in a serving.  Low sodium--140 mg or less in a serving.  Light in sodium--50% less sodium in a serving. For example, if a food that usually has 300 mg of sodium is changed to become light in sodium, it will have 150 mg of sodium.  Reduced sodium--25% less sodium in a serving. For example, if a food that usually has 400 mg  of sodium is changed to reduced sodium, it will have 300 mg of sodium. WHAT FOODS CAN I EAT? Grains Low-sodium cereals, including oats, puffed wheat and rice, and shredded wheat cereals. Low-sodium crackers. Unsalted rice and pasta. Lower-sodium bread.  Vegetables Frozen or fresh vegetables. Low-sodium or reduced-sodium canned vegetables. Low-sodium or reduced-sodium tomato sauce and paste. Low-sodium or reduced-sodium tomato and vegetable juices.  Fruits Fresh, frozen, and canned fruit. Fruit juice.  Meat and Other Protein Products Low-sodium canned tuna and salmon. Fresh or frozen meat, poultry, seafood, and fish. Lamb. Unsalted nuts. Dried beans, peas, and lentils without added salt. Unsalted canned beans. Homemade soups without salt. Eggs.  Dairy Milk. Soy milk. Ricotta cheese. Low-sodium or reduced-sodium cheeses. Yogurt.  Condiments Fresh and dried herbs and spices. Salt-free seasonings. Onion and garlic powders. Low-sodium varieties of mustard and ketchup. Lemon juice.  Fats and Oils Reduced-sodium salad dressings. Unsalted butter.  Other Unsalted popcorn and pretzels.  The items listed above may not be a complete list of recommended foods or beverages. Contact your dietitian for more options. WHAT FOODS ARE NOT RECOMMENDED? Grains Instant hot cereals. Bread stuffing, pancake, and biscuit mixes. Croutons. Seasoned rice or pasta mixes. Noodle soup cups. Boxed or frozen macaroni and cheese. Self-rising flour. Regular salted crackers. Vegetables Regular canned vegetables. Regular canned tomato sauce and paste. Regular tomato and vegetable juices. Frozen vegetables in sauces. Salted french fries. Olives. Rosita Fire. Relishes. Sauerkraut. Salsa. Meat and Other Protein Products Salted, canned, smoked, spiced, or pickled meats, seafood, or fish. Bacon, ham, sausage, hot dogs, corned beef, chipped beef, and packaged luncheon meats. Salt pork. Jerky. Pickled herring. Anchovies,  regular canned tuna, and sardines. Salted nuts. Dairy Processed cheese and cheese spreads. Cheese curds. Blue cheese and cottage cheese. Buttermilk.  Condiments Onion and garlic salt, seasoned salt, table salt, and sea salt. Canned and packaged gravies. Worcestershire sauce. Tartar sauce. Barbecue sauce. Teriyaki sauce. Soy sauce, including reduced sodium. Steak sauce. Fish sauce. Oyster sauce. Cocktail sauce. Horseradish. Regular ketchup and mustard. Meat flavorings and tenderizers. Bouillon cubes. Hot sauce. Tabasco sauce. Marinades. Taco seasonings. Relishes. Fats and Oils Regular salad dressings. Salted butter. Margarine. Ghee. Bacon fat.  Other Potato and tortilla chips. Corn chips and puffs. Salted popcorn and pretzels. Canned or dried soups. Pizza. Frozen entrees and pot pies.  The items listed above may not be a complete list of foods and beverages to avoid. Contact your dietitian for more information. Document Released: 02/26/2002 Document Revised: 09/11/2013 Document Reviewed: 07/11/2013 [facility] Patient Information 2015 [place], [place]. This information is not intended to replace advice given to you by your health care provider. Make sure you discuss any questions you have with your health care provider.

## 2014-05-15 DIAGNOSIS — R41840 Attention and concentration deficit: Secondary | ICD-10-CM | POA: Insufficient documentation

## 2014-07-22 ENCOUNTER — Encounter: Payer: Self-pay | Admitting: Family Medicine

## 2014-10-17 ENCOUNTER — Other Ambulatory Visit: Payer: Self-pay

## 2014-10-17 DIAGNOSIS — Z1231 Encounter for screening mammogram for malignant neoplasm of breast: Secondary | ICD-10-CM

## 2014-11-13 ENCOUNTER — Ambulatory Visit
Admission: RE | Admit: 2014-11-13 | Discharge: 2014-11-13 | Disposition: A | Discharge status: Home / Self Care | Payer: BLUE CROSS/BLUE SHIELD | Source: Ambulatory Visit

## 2014-11-13 ENCOUNTER — Other Ambulatory Visit: Payer: Self-pay

## 2014-11-13 DIAGNOSIS — Z1231 Encounter for screening mammogram for malignant neoplasm of breast: Secondary | ICD-10-CM

## 2015-03-18 ENCOUNTER — Encounter: Payer: Self-pay | Admitting: Obstetrics & Gynecology

## 2015-03-21 ENCOUNTER — Ambulatory Visit: Payer: BC Managed Care – PPO | Admitting: Obstetrics and Gynecology

## 2015-06-25 ENCOUNTER — Ambulatory Visit (INDEPENDENT_AMBULATORY_CARE_PROVIDER_SITE_OTHER): Payer: BLUE CROSS/BLUE SHIELD | Admitting: Obstetrics and Gynecology

## 2015-06-25 ENCOUNTER — Encounter: Payer: Self-pay | Admitting: Obstetrics and Gynecology

## 2015-06-25 VITALS — BP 134/82 | HR 66 | Resp 18 | Ht 69.0 in | Wt 225.6 lb

## 2015-06-25 DIAGNOSIS — Z01419 Encounter for gynecological examination (general) (routine) without abnormal findings: Secondary | ICD-10-CM

## 2015-06-25 NOTE — Progress Notes (Signed)
Patient ID: Brianna Zimmerman, female   DOB: February 25, 1971, 44 y.o.   MRN: [id number] 44 y.o. [id number] Married Caucasian female here for annual exam.    Recent fatigue. Saw PCP.  Just received a B12 injection.  Liver function elevated.  Told her Vit D was in the 30s.  Snoring.  Falls asleep easily during the day.   Had more energy on the Vyvanse.  Terri Skains gave her better focus.  Has cost issues so stopped both.   No hot flashes.   Some leakage of urine with exercise.   Hx of DVT on OCPs.  Hx hirsutism. Vyvanse improved hirsutism.   Son is in [place] with the [employer].   PCP: Birdena Jubilee,  MD  Patient's last menstrual period was 09/20/1992.          Sexually active: Yes.  female partner  The current method of family planning is status post hysterectomy.    Exercising: Yes.    cardio and walking 2x/weekly. 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 MMG:  11-13-14 Density Cat.B/Neg/BiRads 1:[facility]. Colonoscopy:  2006 normal -- was done due to IBS.  Also has fam. Hx colon cancer in Paternal uncle. BMD:   n/a  Result  n/a TDaP:  2010 Flu vaccine last week.  Screening Labs:  Hb today: PCP, Urine today: Unable to void   reports that she has never smoked. She has never used smokeless tobacco. She reports that she does not drink alcohol or use illicit drugs.  Past Medical History  Diagnosis Date  . Anxiety   . Depression   . PCOS (polycystic ovarian syndrome)   . DVT of deep femoral vein (HCC)   . Cervical dysplasia   . Unspecified vitamin D deficiency   . Impaired fasting glucose   . Other malaise and fatigue     Past Surgical History  Procedure Laterality Date  . Cholecystectomy    . Abdominal hysterectomy      TVH-still has oaries--Cervical Dysplasia     Current Outpatient Prescriptions  Medication Sig Dispense Refill  . ibuprofen (ADVIL,MOTRIN) 600 MG tablet Take 1 tablet  (600 mg total) by mouth every 6 (six) hours as needed for pain. 20 tablet 0  . FLUoxetine (PROZAC) 40 MG capsule Take 1 capsule by mouth daily.  0   No current facility-administered medications for this visit.    Family History  Problem Relation Age of Onset  . Cancer Father   . Cancer Paternal Uncle     Colon   . COPD Mother   . Stroke Mother     TIA    ROS:  Pertinent items are noted in HPI.  Otherwise, a comprehensive ROS was negative.  Exam:   BP 134/82 mmHg  Pulse 66  Resp 18  Ht  (1.753 m)  Wt 225 lb 9.6 oz (102.331 kg)  BMI 33.30 kg/m2  LMP 09/20/1992    General appearance: alert, cooperative and appears stated age Head: Normocephalic, without obvious abnormality, atraumatic Neck: no adenopathy, supple, symmetrical, trachea midline and thyroid normal to inspection and palpation Lungs: clear to auscultation bilaterally Breasts: normal appearance, no masses or tenderness, Inspection negative, No nipple retraction or dimpling, No nipple discharge or bleeding, No axillary or supraclavicular adenopathy Heart: regular rate and rhythm Abdomen: soft, non-tender; bowel sounds normal; no masses,  no organomegaly Extremities: extremities normal, atraumatic, no cyanosis or edema Skin: Skin color,  texture, turgor normal. No rashes or lesions Lymph nodes: Cervical, supraclavicular, and axillary nodes normal. No abnormal inguinal nodes palpated Neurologic: Grossly normal  Pelvic: External genitalia:  no lesions              Urethra:  normal appearing urethra with no masses, tenderness or lesions              Bartholins and Skenes: normal                 Vagina: normal appearing vagina with normal color and discharge, no lesions, moderate Kegel strength.               Cervix: absent              Pap taken: Yes.   Bimanual Exam:  Uterus:  uterus absent              Adnexa: normal adnexa and no mass, fullness, tenderness              Rectovaginal: Yes.  .  Confirms.               Anus:  normal sphincter tone, no lesions  Chaperone was present for exam.  Assessment:   Well woman visit with normal exam. Status post TVH for CIN III.  Ovaries remain.  Genuine stress incontinence.  Fatigue.  Undergoing evaluation with PCP.   Plan: Yearly mammogram recommended after age 30.  Recommended self breast exam.  Pap and HR HPV as above. Recommendations for  Calcium, Vitamin D, regular exercise program including cardiovascular and weight bearing exercise, weight loss. Labs performed.  No..     Refills given on medications.  No..    Discussed possibility of sleep evaluation with neurology if symptoms persist.  Discussed Kegels, PT, weight loss, Poise products, and surgery for stress incontinence.  Will do Kegels for now.  Follow up annually and prn.      After visit summary provided.

## 2015-06-25 NOTE — Patient Instructions (Signed)

## 2015-07-01 LAB — IPS PAP TEST WITH HPV

## 2015-07-22 ENCOUNTER — Encounter (HOSPITAL_COMMUNITY): Payer: Self-pay | Admitting: Emergency Medicine

## 2015-07-22 ENCOUNTER — Emergency Department (HOSPITAL_COMMUNITY)
Admission: EM | Admit: 2015-07-22 | Discharge: 2015-07-22 | Disposition: A | Discharge status: Home / Self Care | Payer: BLUE CROSS/BLUE SHIELD | Attending: Emergency Medicine | Admitting: Emergency Medicine

## 2015-07-22 ENCOUNTER — Emergency Department (HOSPITAL_COMMUNITY): Payer: BLUE CROSS/BLUE SHIELD

## 2015-07-22 DIAGNOSIS — K5901 Slow transit constipation: Secondary | ICD-10-CM

## 2015-07-22 DIAGNOSIS — Z8639 Personal history of other endocrine, nutritional and metabolic disease: Secondary | ICD-10-CM | POA: Diagnosis not present

## 2015-07-22 DIAGNOSIS — F329 Major depressive disorder, single episode, unspecified: Secondary | ICD-10-CM | POA: Insufficient documentation

## 2015-07-22 DIAGNOSIS — Z3202 Encounter for pregnancy test, result negative: Secondary | ICD-10-CM | POA: Diagnosis not present

## 2015-07-22 DIAGNOSIS — Z86718 Personal history of other venous thrombosis and embolism: Secondary | ICD-10-CM | POA: Insufficient documentation

## 2015-07-22 DIAGNOSIS — Z8742 Personal history of other diseases of the female genital tract: Secondary | ICD-10-CM | POA: Diagnosis not present

## 2015-07-22 DIAGNOSIS — R109 Unspecified abdominal pain: Secondary | ICD-10-CM

## 2015-07-22 DIAGNOSIS — Z90710 Acquired absence of both cervix and uterus: Secondary | ICD-10-CM | POA: Insufficient documentation

## 2015-07-22 DIAGNOSIS — Z79899 Other long term (current) drug therapy: Secondary | ICD-10-CM | POA: Diagnosis not present

## 2015-07-22 DIAGNOSIS — Z9049 Acquired absence of other specified parts of digestive tract: Secondary | ICD-10-CM | POA: Insufficient documentation

## 2015-07-22 DIAGNOSIS — R102 Pelvic and perineal pain: Secondary | ICD-10-CM

## 2015-07-22 DIAGNOSIS — F419 Anxiety disorder, unspecified: Secondary | ICD-10-CM | POA: Diagnosis not present

## 2015-07-22 DIAGNOSIS — R1031 Right lower quadrant pain: Secondary | ICD-10-CM | POA: Insufficient documentation

## 2015-07-22 LAB — URINALYSIS, ROUTINE W REFLEX MICROSCOPIC
BILIRUBIN URINE: NEGATIVE
Glucose, UA: NEGATIVE mg/dL
HGB URINE DIPSTICK: NEGATIVE
KETONES UR: NEGATIVE mg/dL
Leukocytes, UA: NEGATIVE
Nitrite: NEGATIVE
PH: 5 (ref 5.0–8.0)
Protein, ur: NEGATIVE mg/dL
SPECIFIC GRAVITY, URINE: 1.029 (ref 1.005–1.030)
UROBILINOGEN UA: 0.2 mg/dL (ref 0.0–1.0)

## 2015-07-22 LAB — BASIC METABOLIC PANEL
Anion gap: 9 (ref 5–15)
BUN: 20 mg/dL (ref 6–20)
CALCIUM: 9.2 mg/dL (ref 8.9–10.3)
CO2: 19 mmol/L — AB (ref 22–32)
CREATININE: 0.97 mg/dL (ref 0.44–1.00)
Chloride: 109 mmol/L (ref 101–111)
GFR calc Af Amer: >60 mL/min (ref >60)
GFR calc non Af Amer: >60 mL/min (ref >60)
GLUCOSE: 114 mg/dL — AB (ref 65–99)
Potassium: 4.1 mmol/L (ref 3.5–5.1)
Sodium: 137 mmol/L (ref 135–145)

## 2015-07-22 LAB — CBC
HEMATOCRIT: 42.6 % (ref 36.0–46.0)
Hemoglobin: 15 g/dL (ref 12.0–15.0)
MCH: 31.5 pg (ref 26.0–34.0)
MCHC: 35.2 g/dL (ref 30.0–36.0)
MCV: 89.5 fL (ref 78.0–100.0)
Platelets: 235 10*3/uL (ref 150–400)
RBC: 4.76 MIL/uL (ref 3.87–5.11)
RDW: 11.7 % (ref 11.5–15.5)
WBC: 7.7 10*3/uL (ref 4.0–10.5)

## 2015-07-22 LAB — HCG, QUANTITATIVE, PREGNANCY

## 2015-07-22 MED ORDER — IBUPROFEN 800 MG PO TABS: 800.0000 mg | ORAL_TABLET | Freq: Three times a day (TID) | ORAL | Status: DC | PRN

## 2015-07-22 NOTE — ED Notes (Signed)
Pt not in room for discharge 

## 2015-07-22 NOTE — ED Notes (Signed)
Patient transported to X-ray 

## 2015-07-22 NOTE — ED Provider Notes (Signed)
CSN: [id number]     Arrival date & time 07/22/15  0341 History   First MD Initiated Contact with Patient 07/22/15 0357     Chief Complaint  Patient presents with  . Flank Pain     (Consider location/radiation/quality/duration/timing/severity/associated sxs/prior Treatment) Patient is a 44 y.o. female presenting with abdominal pain. The history is provided by the patient.  Abdominal Pain Pain location:  Suprapubic and RLQ Pain quality: sharp   Pain radiates to:  Does not radiate Pain severity:  Mild Onset quality:  Gradual Duration:  1 week Timing:  Intermittent Progression:  Waxing and waning Chronicity:  New Relieved by:  Nothing Worsened by:  Nothing tried Ineffective treatments:  Acetaminophen Associated symptoms: no chest pain, no fever, no hematochezia, no shortness of breath, no vaginal bleeding, no vaginal discharge and no vomiting     Past Medical History  Diagnosis Date  . Anxiety   . Depression   . PCOS (polycystic ovarian syndrome)   . DVT of deep femoral vein (HCC)   . Cervical dysplasia   . Unspecified vitamin D deficiency   . Impaired fasting glucose   . Other malaise and fatigue    Past Surgical History  Procedure Laterality Date  . Cholecystectomy    . Abdominal hysterectomy      TVH-still has oaries--Cervical Dysplasia    Family History  Problem Relation Age of Onset  . Cancer Father   . Cancer Paternal Uncle     Colon   . COPD Mother   . Stroke Mother     TIA   Social History  Substance Use Topics  . Smoking status: Never Smoker   . Smokeless tobacco: Never Used  . Alcohol Use: No   OB History    Gravida Para Term Preterm AB TAB SAB Ectopic Multiple Living   Review of Systems  Constitutional: Negative for fever.  Respiratory: Negative for shortness of breath.   Cardiovascular: Negative for chest pain.  Gastrointestinal: Positive for abdominal pain. Negative for vomiting and hematochezia.  Genitourinary: Negative  for vaginal bleeding and vaginal discharge.      Allergies  Tavist  Home Medications   Prior to Admission medications   Medication Sig Start Date End Date Taking? Authorizing Provider  cyanocobalamin (,VITAMIN B-12,) 1000 MCG/ML injection Inject 0.5 mLs into the muscle every 14 (fourteen) days. 07/16/15 07/15/16 Yes Historical Provider, MD  diphenhydramine-acetaminophen (TYLENOL PM) 25-500 MG TABS tablet Take 1 tablet by mouth at bedtime as needed (sleep).   Yes Historical Provider, MD  FLUoxetine (PROZAC) 40 MG capsule Take 40 mg by mouth daily.  06/18/15  Yes Historical Provider, MD   BP 138/103 mmHg  Pulse 70  Temp(Src) 98.1 F (36.7 C) (Oral)  Resp 16  Ht  (1.753 m)  Wt 219 lb (99.338 kg)  BMI 32.33 kg/m2  SpO2 98%  LMP 09/20/1992 Physical Exam  Constitutional: She is oriented to person, place, and time. She appears well-developed and well-nourished. No distress.  HENT:  Head: Normocephalic.  Eyes: Conjunctivae are normal.  Neck: Neck supple. No tracheal deviation present.  Cardiovascular: Normal rate and regular rhythm.   Pulmonary/Chest: Effort normal. No respiratory distress.  Abdominal: Soft. Normal appearance. She exhibits no distension. There is tenderness (mild diffuse suprapubic and right lower quadrant). There is no rigidity, no rebound, no guarding, no tenderness at McBurney's point and negative Murphy's sign.  Neurological: She is alert and oriented  to person, place, and time.  Skin: Skin is warm and dry.  Psychiatric: She has a normal mood and affect.    ED Course  Procedures (including critical care time) Labs Review Labs Reviewed  BASIC METABOLIC PANEL - Abnormal; Notable for the following:    CO2 19 (*)    Glucose, Bld 114 (*)    All other components within normal limits  URINALYSIS, ROUTINE W REFLEX MICROSCOPIC (NOT AT [facility])  HCG, QUANTITATIVE, PREGNANCY  CBC    Imaging Review Dg Abd 1 View  07/22/2015  CLINICAL DATA:  Right mid to lower  abdominal pain, bloating for 1 week. Evaluate stool burden. EXAM: ABDOMEN - 1 VIEW COMPARISON:  None. FINDINGS: Moderate stool burden in the right colon. Nonobstructive bowel gas pattern. No free air or organomegaly. No suspicious calcification. Calcified phleboliths in the pelvis. No acute bony abnormality. IMPRESSION: Moderate stool burden in the right colon.  No acute findings. Electronically Signed   By: Charlett Nose M.D.   On: 07/22/2015 07:41   US Transvaginal Non-ob  07/22/2015  CLINICAL DATA:  One week of right-sided pelvic pain; history of previous hysterectomy EXAM: TRANSABDOMINAL AND TRANSVAGINAL ULTRASOUND OF PELVIS DOPPLER ULTRASOUND OF OVARIES TECHNIQUE: Both transabdominal and transvaginal ultrasound examinations of the pelvis were performed. Transabdominal technique was performed for global imaging of the pelvis including uterus, ovaries, adnexal regions, and pelvic cul-de-sac. It was necessary to proceed with endovaginal exam following the transabdominal exam to visualize the ovaries and adnexal structures. Color and duplex Doppler ultrasound was utilized to evaluate blood flow to the ovaries. COMPARISON:  None in PACs FINDINGS: Uterus The uterus is surgically absent. Right ovary Measurements: 4.0 x 2.2 x 2.7 cm. Normal appearance/no adnexal mass. Left ovary Measurements: 1.8 x 1.0 x 1.1 cm. Demonstration of the left ovary was difficult due to bowel gas. The structure measured is assumed to be the ovary. No cystic or solid adnexal mass was observed. Pulsed Doppler evaluation of the right ovary demonstrates normal low-resistance arterial and venous waveforms. Doppler interrogation of the structure on the left revealed no vascular flow. Other findings No free fluid. IMPRESSION: 1. The right ovary is normal in echotexture and vascularity. 2. The left ovary is not definitely identified due to excessive bowel gas. The structure demonstrated could reflect the ovary but no normal vascular flow is  observed. While this could reflect left ovarian torsion, the patient's symptoms are reportedly right-sided. Correlation with the character of the patient's symptoms is needed. 3. Pelvic MRI would be useful in further assessing the left adnexal region. If non gyn processes such as colitis or diverticulitis are felt to be responsible for the patient's right-sided pelvic symptoms, abdominal and pelvic CT scanning would be a useful next imaging step. Electronically Signed   By: David  [place] M.D.   On: 07/22/2015 08:03   US Pelvis Complete  07/22/2015  CLINICAL DATA:  One week of right-sided pelvic pain; history of previous hysterectomy EXAM: TRANSABDOMINAL AND TRANSVAGINAL ULTRASOUND OF PELVIS DOPPLER ULTRASOUND OF OVARIES TECHNIQUE: Both transabdominal and transvaginal ultrasound examinations of the pelvis were performed. Transabdominal technique was performed for global imaging of the pelvis including uterus, ovaries, adnexal regions, and pelvic cul-de-sac. It was necessary to proceed with endovaginal exam following the transabdominal exam to visualize the ovaries and adnexal structures. Color and duplex Doppler ultrasound was utilized to evaluate blood flow to the ovaries. COMPARISON:  None in PACs FINDINGS: Uterus The uterus is surgically absent. Right ovary Measurements: 4.0 x 2.2 x 2.7 cm.  Normal appearance/no adnexal mass. Left ovary Measurements: 1.8 x 1.0 x 1.1 cm. Demonstration of the left ovary was difficult due to bowel gas. The structure measured is assumed to be the ovary. No cystic or solid adnexal mass was observed. Pulsed Doppler evaluation of the right ovary demonstrates normal low-resistance arterial and venous waveforms. Doppler interrogation of the structure on the left revealed no vascular flow. Other findings No free fluid. IMPRESSION: 1. The right ovary is normal in echotexture and vascularity. 2. The left ovary is not definitely identified due to excessive bowel gas. The structure  demonstrated could reflect the ovary but no normal vascular flow is observed. While this could reflect left ovarian torsion, the patient's symptoms are reportedly right-sided. Correlation with the character of the patient's symptoms is needed. 3. Pelvic MRI would be useful in further assessing the left adnexal region. If non gyn processes such as colitis or diverticulitis are felt to be responsible for the patient's right-sided pelvic symptoms, abdominal and pelvic CT scanning would be a useful next imaging step. Electronically Signed   By: David  [place] M.D.   On: 07/22/2015 08:03   [place] Art/ven Flow Abd Pelv Doppler  07/22/2015  CLINICAL DATA:  One week of right-sided pelvic pain; history of previous hysterectomy EXAM: TRANSABDOMINAL AND TRANSVAGINAL ULTRASOUND OF PELVIS DOPPLER ULTRASOUND OF OVARIES TECHNIQUE: Both transabdominal and transvaginal ultrasound examinations of the pelvis were performed. Transabdominal technique was performed for global imaging of the pelvis including uterus, ovaries, adnexal regions, and pelvic cul-de-sac. It was necessary to proceed with endovaginal exam following the transabdominal exam to visualize the ovaries and adnexal structures. Color and duplex Doppler ultrasound was utilized to evaluate blood flow to the ovaries. COMPARISON:  None in PACs FINDINGS: Uterus The uterus is surgically absent. Right ovary Measurements: 4.0 x 2.2 x 2.7 cm. Normal appearance/no adnexal mass. Left ovary Measurements: 1.8 x 1.0 x 1.1 cm. Demonstration of the left ovary was difficult due to bowel gas. The structure measured is assumed to be the ovary. No cystic or solid adnexal mass was observed. Pulsed Doppler evaluation of the right ovary demonstrates normal low-resistance arterial and venous waveforms. Doppler interrogation of the structure on the left revealed no vascular flow. Other findings No free fluid. IMPRESSION: 1. The right ovary is normal in echotexture and vascularity. 2. The left ovary  is not definitely identified due to excessive bowel gas. The structure demonstrated could reflect the ovary but no normal vascular flow is observed. While this could reflect left ovarian torsion, the patient's symptoms are reportedly right-sided. Correlation with the character of the patient's symptoms is needed. 3. Pelvic MRI would be useful in further assessing the left adnexal region. If non gyn processes such as colitis or diverticulitis are felt to be responsible for the patient's right-sided pelvic symptoms, abdominal and pelvic CT scanning would be a useful next imaging step. Electronically Signed   By: David  [place] M.D.   On: 07/22/2015 08:03   I have personally reviewed and evaluated these images and lab results as part of my medical decision-making.   EKG Interpretation None      MDM   Final diagnoses:  Abdominal pain, unspecified abdominal location  Slow transit constipation    44 year old feel presents 1 week of ongoing right lower quadrant, left lower quadrant and suprapubic abdominal pain. Pain has occurred intermittently. Patient is status post hysterectomy. No fever, no elevation of white blood cell count, no typical symptoms of appendicitis such as vomiting, bloody stools, loss of appetite.  Mild clinical appearance is consistent with either ovarian cyst causing lower abdominal discomfort or pain during peristalsis with slow transit constipation. We'll evaluate with plain film for stool burden and pelvic ultrasound to evaluate for cyst. Do not feel that CT scan is warranted at this time given the patient's prolonged course without acute worsening. Care transferred to Dr Lynelle DoctorKnapp at 0700 pending imaging studies with likely discharge. Appears Pt has moderate right sided stool burden which could explain symptoms.  Plan to follow up with PCP as needed and return precautions discussed for worsening or new concerning symptoms.   Lyndal Pulleyaniel Oona Trammel, MD 07/22/15 [phone]

## 2015-07-22 NOTE — Discharge Instructions (Signed)

## 2015-07-22 NOTE — ED Notes (Signed)
Pt states she has had pain in her right flank area off and on for about a week more prominent today  Pt states she feels bloated

## 2015-07-22 NOTE — ED Notes (Signed)
Requested patient to urinate. 

## 2015-07-22 NOTE — ED Notes (Signed)
Patient transported to Ultrasound 

## 2015-10-10 ENCOUNTER — Emergency Department (HOSPITAL_BASED_OUTPATIENT_CLINIC_OR_DEPARTMENT_OTHER): Payer: BLUE CROSS/BLUE SHIELD

## 2015-10-10 ENCOUNTER — Emergency Department (HOSPITAL_BASED_OUTPATIENT_CLINIC_OR_DEPARTMENT_OTHER)
Admission: EM | Admit: 2015-10-10 | Discharge: 2015-10-10 | Disposition: A | Discharge status: Home / Self Care | Payer: BLUE CROSS/BLUE SHIELD | Attending: Emergency Medicine | Admitting: Emergency Medicine

## 2015-10-10 ENCOUNTER — Encounter (HOSPITAL_BASED_OUTPATIENT_CLINIC_OR_DEPARTMENT_OTHER): Payer: Self-pay | Admitting: Emergency Medicine

## 2015-10-10 DIAGNOSIS — Z86718 Personal history of other venous thrombosis and embolism: Secondary | ICD-10-CM | POA: Diagnosis not present

## 2015-10-10 DIAGNOSIS — Z79899 Other long term (current) drug therapy: Secondary | ICD-10-CM | POA: Insufficient documentation

## 2015-10-10 DIAGNOSIS — R5383 Other fatigue: Secondary | ICD-10-CM | POA: Insufficient documentation

## 2015-10-10 DIAGNOSIS — M549 Dorsalgia, unspecified: Secondary | ICD-10-CM | POA: Insufficient documentation

## 2015-10-10 DIAGNOSIS — Z8639 Personal history of other endocrine, nutritional and metabolic disease: Secondary | ICD-10-CM | POA: Insufficient documentation

## 2015-10-10 DIAGNOSIS — Z87410 Personal history of cervical dysplasia: Secondary | ICD-10-CM | POA: Insufficient documentation

## 2015-10-10 DIAGNOSIS — R079 Chest pain, unspecified: Secondary | ICD-10-CM | POA: Diagnosis present

## 2015-10-10 DIAGNOSIS — R0789 Other chest pain: Secondary | ICD-10-CM | POA: Insufficient documentation

## 2015-10-10 DIAGNOSIS — R05 Cough: Secondary | ICD-10-CM | POA: Diagnosis not present

## 2015-10-10 DIAGNOSIS — F419 Anxiety disorder, unspecified: Secondary | ICD-10-CM | POA: Insufficient documentation

## 2015-10-10 DIAGNOSIS — M7989 Other specified soft tissue disorders: Secondary | ICD-10-CM | POA: Diagnosis not present

## 2015-10-10 LAB — D-DIMER, QUANTITATIVE: D-Dimer, Quant: 0.4 ug/mL-FEU (ref 0.00–0.50)

## 2015-10-10 LAB — CBC WITH DIFFERENTIAL/PLATELET
BASOS ABS: 0 10*3/uL (ref 0.0–0.1)
Basophils Relative: 0 %
EOS PCT: 3 %
Eosinophils Absolute: 0.2 10*3/uL (ref 0.0–0.7)
HCT: 43 % (ref 36.0–46.0)
Hemoglobin: 14.6 g/dL (ref 12.0–15.0)
LYMPHS PCT: 28 %
Lymphs Abs: 2 10*3/uL (ref 0.7–4.0)
MCH: 30.9 pg (ref 26.0–34.0)
MCHC: 34 g/dL (ref 30.0–36.0)
MCV: 90.9 fL (ref 78.0–100.0)
Monocytes Absolute: 0.5 10*3/uL (ref 0.1–1.0)
Monocytes Relative: 8 %
Neutro Abs: 4.2 10*3/uL (ref 1.7–7.7)
Neutrophils Relative %: 61 %
PLATELETS: 239 10*3/uL (ref 150–400)
RBC: 4.73 MIL/uL (ref 3.87–5.11)
RDW: 12.1 % (ref 11.5–15.5)
WBC: 7 10*3/uL (ref 4.0–10.5)

## 2015-10-10 LAB — BASIC METABOLIC PANEL
ANION GAP: 6 (ref 5–15)
BUN: 11 mg/dL (ref 6–20)
CO2: 26 mmol/L (ref 22–32)
Calcium: 8.7 mg/dL — ABNORMAL LOW (ref 8.9–10.3)
Chloride: 109 mmol/L (ref 101–111)
Creatinine, Ser: 0.86 mg/dL (ref 0.44–1.00)
GLUCOSE: 78 mg/dL (ref 65–99)
POTASSIUM: 3.5 mmol/L (ref 3.5–5.1)
SODIUM: 141 mmol/L (ref 135–145)

## 2015-10-10 LAB — PROTIME-INR
INR: 0.96 (ref 0.00–1.49)
PROTHROMBIN TIME: 13 s (ref 11.6–15.2)

## 2015-10-10 LAB — APTT: APTT: 30 s (ref 24–37)

## 2015-10-10 NOTE — ED Notes (Signed)
Patient states that she has had a history of blood clots to her left leg. The patient returned from a 4 hour flight and has not "felt well" since the return. The patient also reports that she has swelling to her left leg, and has had SOB with pain on inspiration

## 2015-10-10 NOTE — ED Notes (Signed)
PA student at bedside.

## 2015-10-10 NOTE — ED Provider Notes (Signed)
CSN: [id number]     Arrival date & time 10/10/15  1602 History   By signing my name below, I, [name], attest that this documentation has been prepared under the direction and in the presence of [name], MD . Electronically Signed: [name], Scribe. 10/10/2015. 6:35 PM.   Chief Complaint  Patient presents with  . Shortness of Breath   The history is provided by the patient. No language interpreter was used.   HPI Comments:  Brianna Zimmerman is a 45 y.o. female with a Hx of blood clots, who presents to the Emergency Department complaining of gradual onset, moderate, central chest discomfort beginning today. Pt states the chest pain is sometimes worse with deep breath. Pt notes she returned from a trip to [place] three days ago.  Pt reports associated swelling in left leg, "extreme" fatigue, dry cough, and intermittent back pain. She states she usually experiences swelling in her LE after flying sine her first blood clot in 2012, but it usually resolves itself quickly. Pt reports she elevated the leg with mild relief of swelling. Pt denies chest pain during travel; she also denies fever and hemoptysis. Pt states she does not have a hx of DM. She notes she was on BCP when diagnosed with her previous blood clot. She also notes FHx of blood clots. Pt is no longer on blood thinners or BCP.    Past Medical History  Diagnosis Date  . Anxiety   . Depression   . PCOS (polycystic ovarian syndrome)   . DVT of deep femoral vein (HCC)   . Cervical dysplasia   . Unspecified vitamin D deficiency   . Impaired fasting glucose   . Other malaise and fatigue    Past Surgical History  Procedure Laterality Date  . Cholecystectomy    . Abdominal hysterectomy      TVH-still has oaries--Cervical Dysplasia    Family History  Problem Relation Age of Onset  . Cancer Father   . Cancer Paternal Uncle     Colon   . COPD Mother   . Stroke Mother     TIA   Social History  Substance  Use Topics  . Smoking status: Never Smoker   . Smokeless tobacco: Never Used  . Alcohol Use: No   OB History    Gravida Para Term Preterm AB TAB SAB Ectopic Multiple Living   Review of Systems  Constitutional: Positive for fatigue. Negative for fever.  Respiratory: Positive for cough (dry).   Cardiovascular: Positive for chest pain (discomfort) and leg swelling.  Musculoskeletal: Positive for back pain.       Leg swelling  All other systems reviewed and are negative.  Allergies  Tavist  Home Medications   Prior to Admission medications   Medication Sig Start Date End Date Taking? Authorizing Provider  cyanocobalamin (,VITAMIN B-12,) 1000 MCG/ML injection Inject 0.5 mLs into the muscle every 14 (fourteen) days. 07/16/15 07/15/16  Historical Provider, MD  diphenhydramine-acetaminophen (TYLENOL PM) 25-500 MG TABS tablet Take 1 tablet by mouth at bedtime as needed (sleep).    Historical Provider, MD  FLUoxetine (PROZAC) 40 MG capsule Take 40 mg by mouth daily.  06/18/15   Historical Provider, MD  ibuprofen (ADVIL,MOTRIN) 800 MG tablet Take 1 tablet (800 mg total) by mouth every 8 (eight) hours as needed for moderate pain or cramping. 07/22/15   Lyndal Pulley, MD   BP 148/90  mmHg  Pulse 58  Temp(Src) 98.5 F (36.9 C) (Oral)  Resp 16  Ht  (1.753 m)  Wt 225 lb (102.059 kg)  BMI 33.21 kg/m2  SpO2 98%  LMP 09/20/1992 Physical Exam  Constitutional: She appears well-developed and well-nourished. No distress.  HENT:  Head: Normocephalic and atraumatic.  Mouth/Throat: Oropharynx is clear and moist. No oropharyngeal exudate.  Eyes: Conjunctivae and EOM are normal. Pupils are equal, round, and reactive to light. Right eye exhibits no discharge. Left eye exhibits no discharge. No scleral icterus.  Neck: Normal range of motion. Neck supple. No JVD present. No thyromegaly present.  Cardiovascular: Normal rate, regular rhythm, normal heart sounds and intact distal  pulses.  Exam reveals no gallop and no friction rub.   No murmur heard. Pulmonary/Chest: Effort normal and breath sounds normal. No respiratory distress. She has no wheezes. She has no rales.  Reproducible chest tenderness in peristernal borders, R&L   Abdominal: Soft. Bowel sounds are normal. She exhibits no distension and no mass. There is no tenderness.  Musculoskeletal: Normal range of motion. She exhibits no edema or tenderness.  No assymetry, tenderness or rashes to legs  Lymphadenopathy:    She has no cervical adenopathy.  Neurological: She is alert. Coordination normal.  Skin: Skin is warm and dry. No rash noted. No erythema.  Psychiatric: She has a normal mood and affect. Her behavior is normal.  Nursing note and vitals reviewed.  ED Course  Procedures  DIAGNOSTIC STUDIES:  Oxygen Saturation is 98% on RA, normal by my interpretation.    COORDINATION OF CARE:  6:20 PM Informed the patient the d-dimer was negative. Will discharge if [place] is also negative. Discussed treatment plan with pt at bedside and pt agreed to plan.  Labs Review Labs Reviewed  BASIC METABOLIC PANEL - Abnormal; Notable for the following:    Calcium 8.7 (*)    All other components within normal limits  D-DIMER, QUANTITATIVE (NOT AT [facility])  CBC WITH DIFFERENTIAL/PLATELET  APTT  PROTIME-INR    Imaging Review Dg Chest 2 View  10/10/2015  CLINICAL DATA:  Chest pressure today worse recent travel to [place] EXAM: CHEST  2 VIEW COMPARISON:  03/09/2013 FINDINGS: The heart size and mediastinal contours are within normal limits. Both lungs are clear. The visualized skeletal structures are unremarkable. IMPRESSION: No active cardiopulmonary disease. Electronically Signed   By: Esperanza Heir M.D.   On: 10/10/2015 18:00   US Venous Img Lower Unilateral Left  10/10/2015  CLINICAL DATA:  45 year old female with left lateral ankle swelling for 1 week following a 4 hour plane ride. Prior history of deep venous  thrombosis in 2012 in the left popliteal vein. EXAM: LEFT LOWER EXTREMITY VENOUS DOPPLER ULTRASOUND TECHNIQUE: Gray-scale sonography with graded compression, as well as color Doppler and duplex ultrasound were performed to evaluate the lower extremity deep venous systems from the level of the common femoral vein and including the common femoral, femoral, profunda femoral, popliteal and calf veins including the posterior tibial, peroneal and gastrocnemius veins when visible. The superficial great saphenous vein was also interrogated. Spectral Doppler was utilized to evaluate flow at rest and with distal augmentation maneuvers in the common femoral, femoral and popliteal veins. COMPARISON:  No priors. FINDINGS: Contralateral Common Femoral Vein: Respiratory phasicity is normal and symmetric with the symptomatic side. No evidence of thrombus. Normal compressibility. Common Femoral Vein: No evidence of thrombus. Normal compressibility, respiratory phasicity and response to augmentation. Saphenofemoral Junction: No evidence of thrombus. Normal compressibility and  flow on color Doppler imaging. Profunda Femoral Vein: No evidence of thrombus. Normal compressibility and flow on color Doppler imaging. Femoral Vein: No evidence of thrombus. Normal compressibility, respiratory phasicity and response to augmentation. Popliteal Vein: No evidence of thrombus. Normal compressibility, respiratory phasicity and response to augmentation. Calf Veins: No evidence of thrombus. Normal compressibility and flow on color Doppler imaging. Superficial Great Saphenous Vein: No evidence of thrombus. Normal compressibility and flow on color Doppler imaging. Venous Reflux:  None. Other Findings:  None. IMPRESSION: No evidence of left lower extremity deep venous thrombosis. Electronically Signed   By: Trudie Reed M.D.   On: 10/10/2015 18:17   I have personally reviewed and evaluated these images and lab results as part of my medical  decision-making.   EKG Interpretation   Date/Time:  Friday October 10 2015 17:24:27 EST Ventricular Rate:  57 PR Interval:  132 QRS Duration: 97 QT Interval:  615 QTC Calculation: 599 R Axis:   46 Text Interpretation:  Sinus rhythm Borderline T wave abnormalities  Prolonged QT interval No old tracing to compare Confirmed by Karmela Bram  MD,  Joselynne Killam ([id number]) on 10/10/2015 6:10:24 PM      MDM   Final diagnoses:  Chest pain, unspecified chest pain type   Meds given in ED:  Medications - No data to display  Discharge Medication List as of 10/10/2015  6:51 PM        Thankfully the patient has an EKG which is rather unremarkable, she has an x-ray showing no acute findings and ultrasound of the leg showing no DVTs. Her d-dimer was normal, vital signs remained in a normal range, no hypoxia, doubt the patient's chest pain or shortness of breath was coming from an acute cardiac or pulmonary etiology.  I personally performed the services described in this documentation, which was scribed in my presence. The recorded information has been reviewed and is accurate.      [name], MD 10/11/15 [phone]

## 2015-10-10 NOTE — ED Notes (Signed)
Patient is alert and oriented x3.  She was given DC instructions and follow up visit instructions.  Patient gave verbal understanding. She was DC ambulatory under her own power to home.  V/S stable.  He was not showing any signs of distress on DC 

## 2015-10-10 NOTE — ED Notes (Signed)
MD at bedside discussing available test results.

## 2015-10-10 NOTE — ED Notes (Signed)
Patient transported to Ultrasound 

## 2015-10-10 NOTE — Discharge Instructions (Signed)

## 2015-12-17 ENCOUNTER — Ambulatory Visit (INDEPENDENT_AMBULATORY_CARE_PROVIDER_SITE_OTHER): Payer: BLUE CROSS/BLUE SHIELD | Admitting: Cardiovascular Disease

## 2015-12-17 ENCOUNTER — Encounter: Payer: Self-pay | Admitting: Cardiovascular Disease

## 2015-12-17 VITALS — BP 126/90 | HR 70 | Ht 69.0 in | Wt 225.0 lb

## 2015-12-17 DIAGNOSIS — I4581 Long QT syndrome: Secondary | ICD-10-CM | POA: Diagnosis not present

## 2015-12-17 DIAGNOSIS — R0789 Other chest pain: Secondary | ICD-10-CM

## 2015-12-17 DIAGNOSIS — Z8249 Family history of ischemic heart disease and other diseases of the circulatory system: Secondary | ICD-10-CM

## 2015-12-17 DIAGNOSIS — I10 Essential (primary) hypertension: Secondary | ICD-10-CM

## 2015-12-17 DIAGNOSIS — R9431 Abnormal electrocardiogram [ECG] [EKG]: Secondary | ICD-10-CM

## 2015-12-17 NOTE — Patient Instructions (Addendum)
Dr Royann Shiversroitoru recommends that you continue on your current medications as directed. Please refer to the Current Medication list given to you today.  Your physician has requested that you have an exercise tolerance test. For further information please visit [email or web address] Please also follow instruction sheet, as given.  Dr Royann Shiversroitoru recommends that you schedule a follow-up appointment in 1 year. You will receive a reminder letter in the mail two months in advance. If you don't receive a letter, please call our office to schedule the follow-up appointment.  If you need a refill on your cardiac medications before your next appointment, please call your pharmacy.    Medication samples have been provided to the patient. Drug name: Edarbyclor 40/12.5 mg Qty: 28 tabs LOT: [id number] Exp.Date: 6/17  Ian Bushmanruitt, [place] 11:59 AM 12/17/2015

## 2015-12-19 ENCOUNTER — Encounter: Payer: Self-pay | Admitting: Cardiovascular Disease

## 2015-12-19 DIAGNOSIS — I10 Essential (primary) hypertension: Secondary | ICD-10-CM

## 2015-12-19 DIAGNOSIS — Z8249 Family history of ischemic heart disease and other diseases of the circulatory system: Secondary | ICD-10-CM | POA: Insufficient documentation

## 2015-12-19 DIAGNOSIS — R9431 Abnormal electrocardiogram [ECG] [EKG]: Secondary | ICD-10-CM | POA: Insufficient documentation

## 2015-12-19 HISTORY — DX: Essential (primary) hypertension: I10

## 2015-12-19 NOTE — Progress Notes (Signed)
Patient ID: Brianna Zimmerman, female   DOB: 1971-03-23, 45 y.o.   MRN: [id number]     Cardiology consult Note    Date:  12/19/2015   ID:  Brianna Zimmerman, DOB 09/18/1971, MRN [id number]  Consultation requested by:  Birdena Jubilee, MD  Reason for consultation: Chest pain, Family history of heart disease  Cardiologist:   Thurmon Fair, MD   Chief Complaint  Patient presents with  . New Patient (Initial Visit)     having some chest pain, no shortness of breath, no edema, no pain or cramping in legs, occassional lightheaded or dizziness, has fatigue    History of Present Illness:  Brianna Zimmerman is a 45 y.o. female presenting primarily with concerns surrounding possible long QT syndrome.  She has several female family members with heart problems. Her mother was diagnosed by some type of imaging study as having had an old heart attack. Her mother has never had syncope or cardiac arrest. Her maternal aunt died of sudden cardiac death at age 8. Her maternal grandmother had a "heart attack" and so did all her sisters (unclear if these were true coronary events). Her maternal grandfather had bypass surgery. She has very little knowledge about the medical problems on her father's side of the family.  Her personal history significant for an episode of deep venous thrombosis of the lower extremities that occurred about 2 or 3 years ago when she received a new oral contraceptive. She is no longer taking anticoagulants. She has systemic hypertension and is currently taking samples of medications while she is waiting for her insurance to approve Micardis HCT. She is just about to run out of her samples of EdarbiClor 40/12.5 so we provided more samples for her today.  She has never experienced syncope and denies palpitations. She denies exertional chest pain or shortness of breath either at rest or with exertion. Occasional chest discomfort at rest that lasts for a minute or so, no clear trigger or pattern.  She does not have a diabetes and is a nonsmoker. She is mildly obese and does not exercise regularly.  We did a few ECGs today. Her QT interval is a little difficult to measure since her T waves are quite flat (question hypokalemia). Uncorrected QT interval is 422 ms. Corrected QT interval is 464 ms.  Past Medical History  Diagnosis Date  . Anxiety   . Depression   . PCOS (polycystic ovarian syndrome)   . DVT of deep femoral vein (HCC)   . Cervical dysplasia   . Unspecified vitamin D deficiency   . Impaired fasting glucose   . Other malaise and fatigue   . Essential hypertension 12/19/2015    Past Surgical History  Procedure Laterality Date  . Cholecystectomy    . Abdominal hysterectomy      TVH-still has oaries--Cervical Dysplasia     Current Medications: Outpatient Prescriptions Prior to Visit  Medication Sig Dispense Refill  . cyanocobalamin (,VITAMIN B-12,) 1000 MCG/ML injection Inject 0.5 mLs into the muscle every 14 (fourteen) days.    . diphenhydramine-acetaminophen (TYLENOL PM) 25-500 MG TABS tablet Take 1 tablet by mouth at bedtime as needed (sleep).    Marland Kitchen FLUoxetine (PROZAC) 40 MG capsule Take 40 mg by mouth daily.   0  . ibuprofen (ADVIL,MOTRIN) 800 MG tablet Take 1 tablet (800 mg total) by mouth every 8 (eight) hours as needed for moderate pain or cramping. 21 tablet 0   No facility-administered medications prior to visit.  Allergies:   Ace inhibitors and Tavist   Social History   Social History  . Marital Status: Married    Spouse Name: Therapist, sportsKelvin  . Number of Children: 1  . Years of Education: 12+   Occupational History  . Underwriter     [employer]   Social History Main Topics  . Smoking status: Never Smoker   . Smokeless tobacco: Never Used  . Alcohol Use: No  . Drug Use: No  . Sexual Activity:    Partners: Male    Birth Control/ Protection: Surgical     Comment: TVH   Other Topics Concern  . 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[employer])    Education: Engineer, agriculturalHigh School Graduate; Scientific laboratory technicianeal Estate License   Tobacco Use/Exposure:  None    Alcohol Use:  White Wine (2 glasses per week)    Drug Use:  None    Diet:  Regular   Exercise:  None    Hobbies: Reading, Traveling         Epworth Sleepiness Scale = 18 (as of 12/17/2015)              Family History:  The patient's family history includes COPD in her mother; Cancer in her father, maternal grandfather, and paternal uncle; Heart attack in her maternal grandfather, maternal grandmother, and mother; Hypertension in her father, maternal grandfather, maternal grandmother, and mother; Stroke in her mother.   ROS:   Please see the history of present illness.    ROS All other systems reviewed and are negative.   PHYSICAL EXAM:   VS:  BP 126/90 mmHg  Pulse 70  Ht 5\' 9"  (1.753 m)  Wt 102.059 kg (225 lb)  BMI 33.21 kg/m2  LMP 09/20/1992   GEN: Well nourished, well developed, in no acute distress HEENT: normal Neck: no JVD, carotid bruits, or masses Cardiac: RRR; no murmurs, rubs, or gallops,no edema  Respiratory:  clear to auscultation bilaterally, normal work of breathing GI: soft, nontender, nondistended, + BS MS: no deformity or atrophy Skin: warm and dry, no rash Neuro:  Alert and Oriented x 3, Strength and sensation are intact Psych: euthymic mood, full affect  Wt Readings from Last 3 Encounters:  12/17/15 102.059 kg (225 lb)  10/10/15 102.059 kg (225 lb)  07/22/15 99.338 kg (219 lb)      Studies/Labs Reviewed:   EKG:  EKG is ordered today.  The ekg ordered today demonstrates Sinus rhythm, flattened T waves, borderline prolonged QT interval  Recent Labs: 10/10/2015: BUN 11; Creatinine, Ser 0.86; Hemoglobin 14.6; Platelets 239; Potassium 3.5; Sodium 141   Lipid Panel    Component Value Date/Time    CHOL 177 10/08/2013 0939   TRIG 101 10/08/2013 0939   HDL 58 10/08/2013 0939   CHOLHDL 3.1 10/08/2013 0939   VLDL 20 10/08/2013 0939   LDLCALC 99 10/08/2013 0939     ASSESSMENT:    1. Other chest pain   2. Long QT interval   3. Family history of premature coronary artery disease   4. Essential hypertension      PLAN:  In order of problems listed above:  1. Atypical chest pain: A treadmill stress test is appropriate to evaluate her chest pain, although by history it is less likely to represent coronary insufficiency. 2. Borderline  QTC: her QT interval on the test before and after exercise. I'll try to ask one of my EP partners whether or not he would consider antiarrhythmic challenge. My suspicion for true long QT syndrome is low. For the time being I would definitely recommend avoiding medications to prolong the QT interval such as macrolide antibiotics, quinolones, neuroleptics, etc. Asked her to always ask her pharmacist about the risk of QT prolongation before starting any medication. 3. FHx CAD: There is convincing evidence of premature coronary disease in first-degree female relatives. I think this is much more likely to be a threat to her than potential long QT. Her lipid profile is actually not bad(checked in 2015). Her major un resolved coronary risk factors are hypertension and obesity. Weight loss and regular physical exercise were discussed today.  4. HTN: Her blood pressure is not too bad, but her diastolic is still borderline elevated. She will be switching to a completely new prescription soon, so would reevaluate after she has reached steady state on the new medication. Would also recheck her potassium level on the new medication since her ECG suggests hypokalemia.    Medication Adjustments/Labs and Tests Ordered: Current medicines are reviewed at length with the patient today.  Concerns regarding medicines are outlined above.  Medication changes, Labs and Tests ordered  today are listed in the Patient Instructions below. Patient Instructions  Dr Royann Shivers recommends that you continue on your current medications as directed. Please refer to the Current Medication list given to you today.  Your physician has requested that you have an exercise tolerance test. For further information please visit [email or web address] Please also follow instruction sheet, as given.  Dr Royann Shivers recommends that you schedule a follow-up appointment in 1 year. You will receive a reminder letter in the mail two months in advance. If you don't receive a letter, please call our office to schedule the follow-up appointment.  If you need a refill on your cardiac medications before your next appointment, please call your pharmacy.    Medication samples have been provided to the patient. Drug name: Edarbyclor 40/12.5 mg Qty: 28 tabs LOT: [id number] Exp.Date: 6/17  Ian Bushman 11:59 AM 12/17/2015    Joie Bimler, MD  12/19/2015 2:27 PM    [facility] [address] [place], [place], [place]  [address] Phone: [phone]; Fax: [phone]

## 2015-12-23 ENCOUNTER — Encounter: Payer: Self-pay | Admitting: Cardiovascular Disease

## 2015-12-25 ENCOUNTER — Other Ambulatory Visit: Payer: Self-pay

## 2015-12-25 DIAGNOSIS — Z1231 Encounter for screening mammogram for malignant neoplasm of breast: Secondary | ICD-10-CM

## 2016-01-01 ENCOUNTER — Telehealth (HOSPITAL_COMMUNITY): Payer: Self-pay

## 2016-01-01 NOTE — Telephone Encounter (Signed)
Encounter complete. 

## 2016-01-06 ENCOUNTER — Ambulatory Visit (HOSPITAL_COMMUNITY)
Admission: RE | Admit: 2016-01-06 | Discharge: 2016-01-06 | Disposition: A | Discharge status: Home / Self Care | Payer: BLUE CROSS/BLUE SHIELD | Source: Ambulatory Visit | Attending: Cardiology | Admitting: Cardiology

## 2016-01-06 DIAGNOSIS — R0789 Other chest pain: Secondary | ICD-10-CM | POA: Insufficient documentation

## 2016-01-06 LAB — EXERCISE TOLERANCE TEST
CHL CUP MPHR: 174 {beats}/min
CHL RATE OF PERCEIVED EXERTION: 17
CSEPPHR: 169 {beats}/min
Estimated workload: 10.5 METS
Exercise duration (min): 9 min
Exercise duration (sec): 19 s
Percent HR: 97 %
Rest HR: 76 {beats}/min

## 2016-01-08 ENCOUNTER — Telehealth: Payer: Self-pay | Admitting: Cardiovascular Disease

## 2016-01-08 NOTE — Telephone Encounter (Signed)
Results report given to patient, she verbalized understanding & thanks. Aware to call if new questions.

## 2016-01-08 NOTE — Telephone Encounter (Signed)
New message ° ° ° ° ° °Calling to get stress test results °

## 2016-01-15 ENCOUNTER — Ambulatory Visit
Admission: RE | Admit: 2016-01-15 | Discharge: 2016-01-15 | Disposition: A | Discharge status: Home / Self Care | Payer: BLUE CROSS/BLUE SHIELD | Source: Ambulatory Visit

## 2016-01-15 DIAGNOSIS — Z1231 Encounter for screening mammogram for malignant neoplasm of breast: Secondary | ICD-10-CM

## 2016-01-28 ENCOUNTER — Ambulatory Visit (INDEPENDENT_AMBULATORY_CARE_PROVIDER_SITE_OTHER): Payer: BLUE CROSS/BLUE SHIELD | Admitting: Family Medicine

## 2016-01-28 VITALS — BP 120/82 | HR 72 | Temp 98.0°F | Resp 16 | Ht 69.0 in | Wt 231.2 lb

## 2016-01-28 DIAGNOSIS — J029 Acute pharyngitis, unspecified: Secondary | ICD-10-CM | POA: Diagnosis not present

## 2016-01-28 MED ORDER — CHLORHEXIDINE GLUCONATE 0.12 % MT SOLN: 15.0000 mL | Freq: Two times a day (BID) | OROMUCOSAL | Status: DC

## 2016-01-28 MED ORDER — CEFDINIR 300 MG PO CAPS: 600.0000 mg | ORAL_CAPSULE | Freq: Every day | ORAL | Status: DC

## 2016-01-28 NOTE — Progress Notes (Signed)
Patient ID: Brianna Zimmerman MRN: [id number], DOB: October 08, 1970, 45 y.o. Date of Encounter: 01/28/2016, 3:48 PM  Primary Physician: Birdena JubileeZANARD, ROBYN, MD  Chief Complaint:  Chief Complaint  Patient presents with  . Sore Throat    HPI: 45 y.o. year old female presents with 3 day history of sore throat. Subjective fever and chills. No cough, congestion, rhinorrhea, sinus pressure, otalgia, or headache. Normal hearing. No GI complaints. Able to swallow saliva, but hurts to do so. Decreased appetite secondary to sore throat.   Past Medical History  Diagnosis Date  . Anxiety   . Depression   . PCOS (polycystic ovarian syndrome)   . DVT of deep femoral vein (HCC)   . Cervical dysplasia   . Unspecified vitamin D deficiency   . Impaired fasting glucose   . Other malaise and fatigue   . Essential hypertension 12/19/2015     Home Meds: Prior to Admission medications   Medication Sig Start Date End Date Taking? Authorizing Provider  cyanocobalamin (,VITAMIN B-12,) 1000 MCG/ML injection Inject 0.5 mLs into the muscle every 14 (fourteen) days. 07/16/15 07/15/16 Yes Historical Provider, MD  DULoxetine (CYMBALTA) 30 MG capsule Take 40 mg by mouth daily.   Yes Historical Provider, MD  telmisartan-hydrochlorothiazide (MICARDIS HCT) 40-12.5 MG tablet Take 1 tablet by mouth daily.   Yes Historical Provider, MD  diphenhydramine-acetaminophen (TYLENOL PM) 25-500 MG TABS tablet Take 1 tablet by mouth at bedtime as needed (sleep). Reported on 01/28/2016    Historical Provider, MD  FLUoxetine (PROZAC) 40 MG capsule Take 40 mg by mouth daily. Reported on 01/28/2016 06/18/15   Historical Provider, MD  ibuprofen (ADVIL,MOTRIN) 800 MG tablet Take 1 tablet (800 mg total) by mouth every 8 (eight) hours as needed for moderate pain or cramping. Patient not taking: Reported on 01/28/2016 07/22/15   Lyndal Pulleyaniel Knott, MD    Allergies:  Allergies  Allergen Reactions  . Ace Inhibitors Other (See Comments)    Cough  .  Tavist Hives    Social History   Social History  . Marital Status: Married    Spouse Name: Therapist, sportsKelvin  . Number of Children: 1  . Years of Education: 12+   Occupational History  . Underwriter     [employer]   Social History Main Topics  . Smoking status: Never Smoker   . Smokeless tobacco: Never Used  . Alcohol Use: No  . Drug Use: No  . Sexual Activity:    Partners: Male    Birth Control/ Protection: Surgical     Comment: TVH   Other Topics Concern  . 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[employer])    Education: Engineer, agriculturalHigh School Graduate; Scientific laboratory technicianeal Estate License   Tobacco Use/Exposure:  None    Alcohol Use:  White Wine (2 glasses per week)    Drug Use:  None    Diet:  Regular   Exercise:  None    Hobbies: Reading, Traveling         Epworth Sleepiness Scale = 18 (as of 12/17/2015)              Review of Systems: Constitutional: negative for chills, fever, night sweats or weight changes HEENT: see above Cardiovascular: negative for chest pain or palpitations Respiratory: negative for hemoptysis, wheezing, or  shortness of breath Abdominal: negative for abdominal pain, nausea, vomiting or diarrhea Dermatological: negative for rash Neurologic: negative for headache   Physical Exam: Blood pressure 120/82, pulse 72, temperature 98 F (36.7 C), temperature source Oral, resp. rate 16, height  (1.753 m), weight 231 lb 3.2 oz (104.872 kg), last menstrual period 09/20/1992, SpO2 99 %., Body mass index is 34.13 kg/(m^2). General: Well developed, well nourished, in no acute distress. Head: Normocephalic, atraumatic, eyes without discharge, sclera non-icteric, nares are patent. Bilateral auditory canals clear, TM's are without perforation, pearly grey with reflective cone of light bilaterally. No sinus TTP. Oral  cavity moist, dentition normal. Posterior pharynx with post nasal drip and mild erythema. No peritonsillar abscess or tonsillar exudate. Neck: Supple. No thyromegaly. Full ROM. No lymphadenopathy. Lungs: Clear bilaterally to auscultation without wheezes, rales, or rhonchi. Breathing is unlabored. Heart: RRR with S1 S2. No murmurs, rubs, or gallops appreciated. Abdomen: Soft, non-tender, non-distended with normoactive bowel sounds. No hepatomegaly. No rebound/guarding. No obvious abdominal masses. Msk:  Strength and tone normal for age. Extremities: No clubbing or cyanosis. No edema. Neuro: Alert and oriented X 3. Moves all extremities spontaneously. CNII-XII grossly in tact. Psych:  Responds to questions appropriately with a normal affect.     ASSESSMENT AND PLAN:  45 y.o. year old female with acute pharyngitis Acute pharyngitis, unspecified etiology - Plan: Culture, Group A Strep, chlorhexidine (PERIDEX) 0.12 % solution, cefdinir (OMNICEF) 300 MG capsule   - -Tylenol/Motrin prn -Rest/fluids -RTC precautions -RTC 3-5 days if no improvement  Signed, Elvina Sidle, MD 01/28/2016 3:48 PM

## 2016-01-28 NOTE — Patient Instructions (Addendum)

## 2016-01-30 ENCOUNTER — Telehealth: Payer: Self-pay | Admitting: Family Medicine

## 2016-01-30 LAB — CULTURE, GROUP A STREP: Organism ID, Bacteria: NORMAL

## 2016-01-30 NOTE — Telephone Encounter (Signed)
I definitely recommend that she come in for re-evaluation.

## 2016-01-30 NOTE — Telephone Encounter (Signed)
Spoke with pt, she states she has no other symptoms but her throat is just hurting more.

## 2016-01-30 NOTE — Telephone Encounter (Signed)
Patient feel antibiotic is not working. Patient states she still have a bad sore throat. No improvements. Patient request for a stronger antibiotic. [facility] [address], [place], [place] [id number] [phone]

## 2016-01-30 NOTE — Telephone Encounter (Signed)
Please call this patient. The throat culture was NEGATIVE. There is not a bacterial infection in the throat. Dr. Loma BostonLauenstein's instructions were to RTC in 3-5 days if not improving. Does she have any other symptoms at this point? Fever? Sinus pressure/congestion/draiange? Cough?

## 2016-02-02 NOTE — Telephone Encounter (Signed)
Left message to return call jp/cma

## 2016-02-04 ENCOUNTER — Encounter: Payer: Self-pay | Admitting: Radiology

## 2016-02-04 NOTE — Telephone Encounter (Signed)
Attempted to call pt, left VM to call back  

## 2016-07-01 ENCOUNTER — Ambulatory Visit: Payer: BLUE CROSS/BLUE SHIELD | Admitting: Obstetrics and Gynecology

## 2016-08-26 ENCOUNTER — Ambulatory Visit: Payer: BLUE CROSS/BLUE SHIELD | Admitting: Obstetrics and Gynecology

## 2016-09-15 NOTE — Progress Notes (Signed)
45 y.o. [id number] Married Native American female here for annual exam.    Hx DVT on OCPs.  Still falling asleep easily during the day.  Snores.  Leaks urine with sneeze. "It's awful."  Difficulty with losing weight.  Has not done Weight Watchers.   Has vaginal odor concerns.  (Stated this after her exam was done. )  PCP:   Dr. Birdena Jubileeobyn Zanard  Patient's last menstrual period was 09/20/1992.           Sexually active: Yes.    The current method of family planning is status post hysterectomy. Final path CIN III, negative margins.    Exercising: Yes.    cardio/weights Smoker:  no  Health Maintenance: Pap:  06-25-15 Neg:Neg HR HPV History of abnormal Pap:  Yes, 1999 CIN 3--reason for Hysterectomy. MMG: 01-15-16 Density B/Neg/BiRads1:TBC Colonoscopy: 2006 normal -- was done due to IBS.  Also has fam. Hx colon cancer in Paternal uncle.   BMD:   n/a  Result  n/a TDaP:  2010 Gardasil:   N/A   Screening Labs:  Hb today: Blood drawn, Urine today: unable to void   reports that she has never smoked. She has never used smokeless tobacco. She reports that she does not drink alcohol or use drugs.  Past Medical History:  Diagnosis Date  . Anxiety   . Cervical dysplasia   . Depression   . DVT of deep femoral vein (HCC)   . Essential hypertension 12/19/2015  . Impaired fasting glucose   . Other malaise and fatigue   . PCOS (polycystic ovarian syndrome)   . Unspecified vitamin D deficiency     Past Surgical History:  Procedure Laterality Date  . ABDOMINAL HYSTERECTOMY     TVH-still has oaries--Cervical Dysplasia   . CHOLECYSTECTOMY      Current Outpatient Prescriptions  Medication Sig Dispense Refill  . FLUoxetine (PROZAC) 40 MG capsule Take 40 mg by mouth daily. Reported on 01/28/2016  0  . telmisartan-hydrochlorothiazide (MICARDIS HCT) 40-12.5 MG tablet Take 1 tablet by mouth daily.     No current facility-administered medications for this visit.     Family History  Problem  Relation Age of Onset  . Cancer Father   . Hypertension Father   . Cancer Paternal Uncle     Colon   . COPD Mother   . Stroke Mother     TIA  . Heart attack Mother   . Hypertension Mother   . Heart attack Maternal Grandmother   . Hypertension Maternal Grandmother   . Hypertension Maternal Grandfather   . Cancer Maternal Grandfather   . Heart attack Maternal Grandfather     ROS:  Pertinent items are noted in HPI.  Otherwise, a comprehensive ROS was negative.  Exam:   BP 118/80 (BP Location: Right Arm, Patient Position: Sitting, Cuff Size: Normal)   Pulse 76   Resp 16   Ht 5\' 9"  (1.753 m)   Wt 232 lb (105.2 kg)   LMP 09/20/1992   BMI 34.26 kg/m     General appearance: alert, cooperative and appears stated age Head: Normocephalic, without obvious abnormality, atraumatic Neck: no adenopathy, supple, symmetrical, trachea midline and thyroid normal to inspection and palpation Lungs: clear to auscultation bilaterally Breasts: normal appearance, no masses or tenderness, No nipple retraction or dimpling, No nipple discharge or bleeding, No axillary or supraclavicular adenopathy Heart: regular rate and rhythm Abdomen: soft, non-tender; no masses, no organomegaly Extremities: extremities normal, atraumatic, no cyanosis or edema Skin: Skin color, texture,  turgor normal. No rashes or lesions Lymph nodes: Cervical, supraclavicular, and axillary nodes normal. No abnormal inguinal nodes palpated Neurologic: Grossly normal  Pelvic: External genitalia:  no lesions              Urethra:  normal appearing urethra with no masses, tenderness or lesions              Bartholins and Skenes: normal                 Vagina: normal appearing vagina with normal color and discharge, no lesions              Cervix:  Absent.              Pap taken: Yes.   Bimanual Exam:  Uterus:  Absent.               Adnexa: no mass, fullness, tenderness              Rectal exam: Yes.  .  Confirms.               Anus:  normal sphincter tone, no lesions  Chaperone was present for exam.  Assessment:   Well woman visit with normal exam. Status post TVH for CIN III.  Ovaries remain.  Genuine stress incontinence.  Sleep disturbance.  Hypersomnia? Vaginal odor reported after exam done today.   Plan: Mammogram screening discussed. Recommended self breast awareness. Pap and HR HPV as above. Guidelines for Calcium, Vitamin D, regular exercise program including cardiovascular and weight bearing exercise. Routine labs. Referral to Neurology for sleep disturbance.  Discussed weight loss, Impressa PT, and surgery for GSI.   She wants to concentrate on weight loss right now. If pap shows bacterial vaginosis, will offer treatment. Follow up annually and prn.   After visit summary provided.

## 2016-09-16 ENCOUNTER — Ambulatory Visit (INDEPENDENT_AMBULATORY_CARE_PROVIDER_SITE_OTHER): Payer: BLUE CROSS/BLUE SHIELD | Admitting: Obstetrics and Gynecology

## 2016-09-16 ENCOUNTER — Encounter: Payer: Self-pay | Admitting: Obstetrics and Gynecology

## 2016-09-16 VITALS — BP 118/80 | HR 76 | Resp 16 | Ht 69.0 in | Wt 232.0 lb

## 2016-09-16 DIAGNOSIS — N393 Stress incontinence (female) (male): Secondary | ICD-10-CM

## 2016-09-16 DIAGNOSIS — Z01419 Encounter for gynecological examination (general) (routine) without abnormal findings: Secondary | ICD-10-CM

## 2016-09-16 DIAGNOSIS — G479 Sleep disorder, unspecified: Secondary | ICD-10-CM

## 2016-09-16 LAB — CBC
HCT: 43 % (ref 35.0–45.0)
Hemoglobin: 14.3 g/dL (ref 11.7–15.5)
MCH: 30.9 pg (ref 27.0–33.0)
MCHC: 33.3 g/dL (ref 32.0–36.0)
MCV: 92.9 fL (ref 80.0–100.0)
MPV: 9.4 fL (ref 7.5–12.5)
PLATELETS: 225 10*3/uL (ref 140–400)
RBC: 4.63 MIL/uL (ref 3.80–5.10)
RDW: 13.2 % (ref 11.0–15.0)
WBC: 8 10*3/uL (ref 3.8–10.8)

## 2016-09-16 LAB — TSH: TSH: 1.84 m[IU]/L

## 2016-09-16 NOTE — Patient Instructions (Signed)

## 2016-09-17 LAB — COMPREHENSIVE METABOLIC PANEL
ALK PHOS: 51 U/L (ref 33–115)
ALT: 25 U/L (ref 6–29)
AST: 16 U/L (ref 10–35)
Albumin: 4 g/dL (ref 3.6–5.1)
BUN: 15 mg/dL (ref 7–25)
CHLORIDE: 106 mmol/L (ref 98–110)
CO2: 24 mmol/L (ref 20–31)
Calcium: 9 mg/dL (ref 8.6–10.2)
Creat: 0.88 mg/dL (ref 0.50–1.10)
GLUCOSE: 83 mg/dL (ref 65–99)
Potassium: 3.7 mmol/L (ref 3.5–5.3)
SODIUM: 140 mmol/L (ref 135–146)
TOTAL PROTEIN: 6.3 g/dL (ref 6.1–8.1)
Total Bilirubin: 0.5 mg/dL (ref 0.2–1.2)

## 2016-09-17 LAB — LIPID PANEL
CHOL/HDL RATIO: 3.2 ratio (ref <5.0)
Cholesterol: 161 mg/dL (ref <200)
HDL: 51 mg/dL (ref >50)
LDL CALC: 82 mg/dL (ref <100)
Triglycerides: 140 mg/dL (ref <150)
VLDL: 28 mg/dL (ref <30)

## 2016-09-17 LAB — VITAMIN D 25 HYDROXY (VIT D DEFICIENCY, FRACTURES): Vit D, 25-Hydroxy: 54 ng/mL (ref 30–100)

## 2016-09-18 ENCOUNTER — Encounter: Payer: Self-pay | Admitting: Obstetrics and Gynecology

## 2016-09-19 LAB — IPS PAP TEST WITH HPV

## 2016-11-24 ENCOUNTER — Telehealth: Payer: Self-pay | Admitting: Obstetrics and Gynecology

## 2016-11-24 NOTE — Telephone Encounter (Signed)
Patient would like to speak with Dr Edward JollySilva about her last visit and what was discussed.

## 2016-11-24 NOTE — Telephone Encounter (Signed)
Spoke with patient, routing to office administrator for review.

## 2016-11-25 ENCOUNTER — Telehealth: Payer: Self-pay | Admitting: Obstetrics and Gynecology

## 2016-11-25 NOTE — Telephone Encounter (Signed)
Spoke with patient and addressed concerns. Patient appreciative.   Routing to provider for final review. Will close encounter.

## 2016-11-25 NOTE — Telephone Encounter (Signed)
Thank you for the update.  Encounter closed. 

## 2016-11-25 NOTE — Telephone Encounter (Signed)
Patient is returning a call to Andrea °

## 2016-12-03 NOTE — Telephone Encounter (Signed)
Returned patient call.

## 2016-12-27 ENCOUNTER — Other Ambulatory Visit: Payer: Self-pay | Admitting: Family Medicine

## 2016-12-27 DIAGNOSIS — Z1231 Encounter for screening mammogram for malignant neoplasm of breast: Secondary | ICD-10-CM

## 2017-01-17 ENCOUNTER — Ambulatory Visit
Admission: RE | Admit: 2017-01-17 | Discharge: 2017-01-17 | Disposition: A | Discharge status: Home / Self Care | Payer: BLUE CROSS/BLUE SHIELD | Source: Ambulatory Visit | Attending: Family Medicine | Admitting: Family Medicine

## 2017-01-17 DIAGNOSIS — Z1231 Encounter for screening mammogram for malignant neoplasm of breast: Secondary | ICD-10-CM

## 2017-01-23 ENCOUNTER — Emergency Department (HOSPITAL_BASED_OUTPATIENT_CLINIC_OR_DEPARTMENT_OTHER): Payer: BLUE CROSS/BLUE SHIELD

## 2017-01-23 ENCOUNTER — Emergency Department (HOSPITAL_BASED_OUTPATIENT_CLINIC_OR_DEPARTMENT_OTHER)
Admission: EM | Admit: 2017-01-23 | Discharge: 2017-01-23 | Disposition: A | Discharge status: Home / Self Care | Payer: BLUE CROSS/BLUE SHIELD | Attending: Emergency Medicine | Admitting: Emergency Medicine

## 2017-01-23 ENCOUNTER — Encounter (HOSPITAL_BASED_OUTPATIENT_CLINIC_OR_DEPARTMENT_OTHER): Payer: Self-pay | Admitting: Emergency Medicine

## 2017-01-23 DIAGNOSIS — R079 Chest pain, unspecified: Secondary | ICD-10-CM | POA: Diagnosis present

## 2017-01-23 DIAGNOSIS — R0789 Other chest pain: Secondary | ICD-10-CM | POA: Diagnosis not present

## 2017-01-23 DIAGNOSIS — Z79899 Other long term (current) drug therapy: Secondary | ICD-10-CM | POA: Diagnosis not present

## 2017-01-23 DIAGNOSIS — R0602 Shortness of breath: Secondary | ICD-10-CM | POA: Diagnosis not present

## 2017-01-23 DIAGNOSIS — I10 Essential (primary) hypertension: Secondary | ICD-10-CM | POA: Diagnosis not present

## 2017-01-23 LAB — COMPREHENSIVE METABOLIC PANEL
ALBUMIN: 4 g/dL (ref 3.5–5.0)
ALK PHOS: 55 U/L (ref 38–126)
ALT: 21 U/L (ref 14–54)
ANION GAP: 7 (ref 5–15)
AST: 20 U/L (ref 15–41)
BILIRUBIN TOTAL: 0.9 mg/dL (ref 0.3–1.2)
BUN: 14 mg/dL (ref 6–20)
CALCIUM: 9.1 mg/dL (ref 8.9–10.3)
CO2: 24 mmol/L (ref 22–32)
Chloride: 108 mmol/L (ref 101–111)
Creatinine, Ser: 0.92 mg/dL (ref 0.44–1.00)
GFR calc Af Amer: >60 mL/min (ref >60)
GFR calc non Af Amer: >60 mL/min (ref >60)
GLUCOSE: 93 mg/dL (ref 65–99)
Potassium: 4.1 mmol/L (ref 3.5–5.1)
Sodium: 139 mmol/L (ref 135–145)
TOTAL PROTEIN: 7 g/dL (ref 6.5–8.1)

## 2017-01-23 LAB — CBC WITH DIFFERENTIAL/PLATELET
BASOS ABS: 0 10*3/uL (ref 0.0–0.1)
BASOS PCT: 0 %
EOS ABS: 0.2 10*3/uL (ref 0.0–0.7)
Eosinophils Relative: 3 %
HCT: 41.7 % (ref 36.0–46.0)
Hemoglobin: 14.4 g/dL (ref 12.0–15.0)
Lymphocytes Relative: 30 %
Lymphs Abs: 1.8 10*3/uL (ref 0.7–4.0)
MCH: 31.2 pg (ref 26.0–34.0)
MCHC: 34.5 g/dL (ref 30.0–36.0)
MCV: 90.3 fL (ref 78.0–100.0)
MONO ABS: 0.6 10*3/uL (ref 0.1–1.0)
MONOS PCT: 10 %
NEUTROS PCT: 57 %
Neutro Abs: 3.6 10*3/uL (ref 1.7–7.7)
Platelets: 220 10*3/uL (ref 150–400)
RBC: 4.62 MIL/uL (ref 3.87–5.11)
RDW: 12.5 % (ref 11.5–15.5)
WBC: 6.2 10*3/uL (ref 4.0–10.5)

## 2017-01-23 LAB — D-DIMER, QUANTITATIVE (NOT AT ARMC): D DIMER QUANT: 0.51 ug{FEU}/mL — AB (ref 0.00–0.50)

## 2017-01-23 LAB — LIPASE, BLOOD: LIPASE: 29 U/L (ref 11–51)

## 2017-01-23 MED ORDER — IOPAMIDOL (ISOVUE-370) INJECTION 76%
100.0000 mL | Freq: Once | INTRAVENOUS | Status: AC | PRN
  Administered 2017-01-23: 100 mL via INTRAVENOUS

## 2017-01-23 MED ORDER — NAPROXEN 500 MG PO TABS: 500.0000 mg | ORAL_TABLET | Freq: Two times a day (BID) | ORAL | 0 refills | Status: DC

## 2017-01-23 MED ORDER — CYCLOBENZAPRINE HCL 10 MG PO TABS: 10.0000 mg | ORAL_TABLET | Freq: Two times a day (BID) | ORAL | 0 refills | Status: DC | PRN

## 2017-01-23 NOTE — ED Triage Notes (Signed)
Pt c/o LT CP, constant, with radiation to LT upper back since Fri; increases with deep breath

## 2017-01-23 NOTE — ED Provider Notes (Signed)
[facility]   CSN: [id number] Arrival date & time: 01/23/17  1218     History   Chief Complaint Chief Complaint  Patient presents with  . Chest Pain    HPI Brianna Zimmerman is a 46 y.o. female.  HPI Patient reports 3 days of pain on her left chest. The pain is from about her shoulder blade and wraps around to the front of the chest. She reports is worse with cough and deep breath. No sputum production no hemoptysis. Patient reports that she has not had swelling or pain of the lower extremities. She does have a history of a DVT several years ago. That was in the setting of being on birth control pills. She did not have pulmonary embolus in association with that. Her main worry was for possible PE given her history. Patient reports that she was moving some rocks several days ago and is not sure if it was musculoskeletal. She reports however she's been trying ibuprofen and it hasn't been relieving the discomfort. Past Medical History:  Diagnosis Date  . Anxiety   . Cervical dysplasia   . Depression   . DVT of deep femoral vein (HCC)   . Essential hypertension 12/19/2015  . Impaired fasting glucose   . Other malaise and fatigue   . PCOS (polycystic ovarian syndrome)   . Unspecified vitamin D deficiency     Patient Active Problem List   Diagnosis Date Noted  . Long QT interval 12/19/2015  . Family history of premature coronary artery disease 12/19/2015  . Essential hypertension 12/19/2015  . Concentration deficit 05/15/2014  . Hirsutism 03/18/2014  . PCOS (polycystic ovarian syndrome) 12/21/2013  . Obesity, unspecified 12/21/2013  . Need for prophylactic vaccination and inoculation against influenza 08/29/2013  . Other malaise and fatigue 04/26/2013  . Cough 04/26/2013  . Depression 12/31/2012  . Anxiety state, unspecified 12/31/2012  . Unspecified constipation 12/31/2012    Past Surgical History:  Procedure Laterality Date  . ABDOMINAL  HYSTERECTOMY     TVH-still has oaries--Cervical Dysplasia   . CHOLECYSTECTOMY      OB History    Gravida Para Term Preterm AB Living   1 1 1     1    SAB TAB Ectopic Multiple Live Births                   Home Medications    Prior to Admission medications   Medication Sig Start Date End Date Taking? Authorizing Provider  montelukast (SINGULAIR) 10 MG tablet Take 10 mg by mouth at bedtime.   Yes [provider]  cyclobenzaprine (FLEXERIL) 10 MG tablet Take 1 tablet (10 mg total) by mouth 2 (two) times daily as needed for muscle spasms. 01/23/17   Arby BarrettePfeiffer, Rayshaun Needle, MD  FLUoxetine (PROZAC) 40 MG capsule Take 40 mg by mouth daily. Reported on 01/28/2016 06/18/15   [provider]  naproxen (NAPROSYN) 500 MG tablet Take 1 tablet (500 mg total) by mouth 2 (two) times daily. 01/23/17   Arby BarrettePfeiffer, Rc Amison, MD  telmisartan-hydrochlorothiazide (MICARDIS HCT) 40-12.5 MG tablet Take 1 tablet by mouth daily.    [provider]    Family History Family History  Problem Relation Age of Onset  . Cancer Father   . Hypertension Father   . Cancer Paternal Uncle     Colon   . COPD Mother   . Stroke Mother     TIA  . Heart attack Mother   . Hypertension Mother   .  Heart attack Maternal Grandmother   . Hypertension Maternal Grandmother   . Hypertension Maternal Grandfather   . Cancer Maternal Grandfather   . Heart attack Maternal Grandfather     Social History Social History  Substance Use Topics  . Smoking status: Never Smoker  . Smokeless tobacco: Never Used  . Alcohol use No     Allergies   Ace inhibitors and Tavist   Review of Systems Review of Systems  10 Systems reviewed and are negative for acute change except as noted in the HPI.   Physical Exam Updated Vital Signs BP 121/86   Pulse (!) 58   Temp 98.3 F (36.8 C) (Oral)   Resp 17   Ht 5\' 9"  (1.753 m)   Wt 223 lb (101.2 kg)   LMP 09/20/1992   SpO2 98%   BMI 32.93 kg/m   Physical Exam    Constitutional: She is oriented to person, place, and time. She appears well-developed and well-nourished. No distress.  HENT:  Head: Normocephalic and atraumatic.  Eyes: Conjunctivae are normal.  Neck: Neck supple.  Cardiovascular: Normal rate and regular rhythm.   No murmur heard. Pulmonary/Chest: Effort normal and breath sounds normal. No respiratory distress. She exhibits tenderness.  Skin normal no soft tissue changes. Some reproducible chest wall pain in the posterior thoracic rib cage from about ribs 9 through 11.  Abdominal: Soft. There is no tenderness.  Musculoskeletal: She exhibits no edema or tenderness.  Neurological: She is alert and oriented to person, place, and time. No cranial nerve deficit. She exhibits normal muscle tone. Coordination normal.  Skin: Skin is warm and dry.  Psychiatric: She has a normal mood and affect.  Nursing note and vitals reviewed.    ED Treatments / Results  Labs (all labs ordered are listed, but only abnormal results are displayed) Labs Reviewed  D-DIMER, QUANTITATIVE (NOT AT [facility]) - Abnormal; Notable for the following:       Result Value   D-Dimer, Quant 0.51 (*)    All other components within normal limits  COMPREHENSIVE METABOLIC PANEL  LIPASE, BLOOD  CBC WITH DIFFERENTIAL/PLATELET    EKG  EKG Interpretation  Date/Time:  Sunday Jan 23 2017 12:37:01 EDT Ventricular Rate:  76 PR Interval:  130 QRS Duration: 86 QT Interval:  424 QTC Calculation: 477 R Axis:   24 Text Interpretation:  Normal sinus rhythm Nonspecific T wave abnormality Abnormal ECG agree. no change from previous Confirmed by Donnald Garre, MD, Lebron Conners [phone]) on 01/23/2017 1:58:06 PM       Radiology Dg Chest 2 View  Result Date: 01/23/2017 CLINICAL DATA:  Left anterior chest pain for 2 days after doing yard work. Shortness of breath. EXAM: CHEST  2 VIEW COMPARISON:  10/10/2015 and 03/09/2013. FINDINGS: The heart size and mediastinal contours are normal. The lungs are  clear. There is no pleural effusion or pneumothorax. No acute osseous findings are identified. EKG snaps overlie the upper chest. Cholecystectomy clips are noted. IMPRESSION: Stable chest.  No active cardiopulmonary process. Electronically Signed   By: Carey Bullocks M.D.   On: 01/23/2017 13:28   Ct Angio Chest Pe W/cm &/or Wo Cm  Result Date: 01/23/2017 CLINICAL DATA:  Chest pain.  Shortness of breath. EXAM: CT ANGIOGRAPHY CHEST WITH CONTRAST TECHNIQUE: Multidetector CT imaging of the chest was performed using the standard protocol during bolus administration of intravenous contrast. Multiplanar CT image reconstructions and MIPs were obtained to evaluate the vascular anatomy. CONTRAST:  100 mL of Isovue 370 intravenously. COMPARISON:  Chest radiograph of same day. FINDINGS: Cardiovascular: Satisfactory opacification of the pulmonary arteries to the segmental level. No evidence of pulmonary embolism. Normal heart size. No pericardial effusion. Mediastinum/Nodes: No enlarged mediastinal, hilar, or axillary lymph nodes. Thyroid gland, trachea, and esophagus demonstrate no significant findings. Lungs/Pleura: Lungs are clear. No pleural effusion or pneumothorax. Upper Abdomen: No acute abnormality. Musculoskeletal: No chest wall abnormality. No acute or significant osseous findings. Review of the MIP images confirms the above findings. IMPRESSION: No definite evidence of pulmonary embolus. No acute abnormality seen in the chest. Electronically Signed   By: Lupita Raider, M.D.   On: 01/23/2017 15:15    Procedures Procedures (including critical care time)  Medications Ordered in ED Medications  iopamidol (ISOVUE-370) 76 % injection 100 mL (100 mLs Intravenous Contrast Given 01/23/17 1450)     Initial Impression / Assessment and Plan / ED Course  I have reviewed the triage vital signs and the nursing notes.  Pertinent labs & imaging results that were available during my care of the patient were reviewed  by me and considered in my medical decision making (see chart for details).      Final Clinical Impressions(s) / ED Diagnoses   Final diagnoses:  Musculoskeletal chest pain  PE is ruled out by CT study. Patient prior history of DVT and risk for PE with pleuritic-type chest pain. Chest pain is however reproducible as well. Patient cites a history of some recent lifting. At this time, with PE ruled out I feel that muscle skeletal chest pain is most likely etiology. A shunt will be prescribed naproxen and Flexeril for chest wall pain and advised follow-up with her PCP.  New Prescriptions New Prescriptions   CYCLOBENZAPRINE (FLEXERIL) 10 MG TABLET    Take 1 tablet (10 mg total) by mouth 2 (two) times daily as needed for muscle spasms.   NAPROXEN (NAPROSYN) 500 MG TABLET    Take 1 tablet (500 mg total) by mouth 2 (two) times daily.     Arby Barrette, MD 01/23/17 [phone]

## 2017-01-23 NOTE — ED Notes (Signed)
Pt given d/c instructions as per chart. Rx x 2 with precautions. Verbalizes understanding. No questions. 

## 2017-12-16 ENCOUNTER — Emergency Department (HOSPITAL_COMMUNITY)
Admission: EM | Admit: 2017-12-16 | Discharge: 2017-12-16 | Discharge status: Against Medical Advice | Payer: BLUE CROSS/BLUE SHIELD | Attending: Emergency Medicine | Admitting: Emergency Medicine

## 2017-12-16 ENCOUNTER — Emergency Department (HOSPITAL_COMMUNITY): Payer: BLUE CROSS/BLUE SHIELD

## 2017-12-16 ENCOUNTER — Encounter (HOSPITAL_COMMUNITY): Payer: Self-pay | Admitting: *Deleted

## 2017-12-16 DIAGNOSIS — Z5321 Procedure and treatment not carried out due to patient leaving prior to being seen by health care provider: Secondary | ICD-10-CM | POA: Insufficient documentation

## 2017-12-16 DIAGNOSIS — M79662 Pain in left lower leg: Secondary | ICD-10-CM | POA: Insufficient documentation

## 2017-12-16 DIAGNOSIS — M549 Dorsalgia, unspecified: Secondary | ICD-10-CM | POA: Diagnosis not present

## 2017-12-16 DIAGNOSIS — R079 Chest pain, unspecified: Secondary | ICD-10-CM | POA: Insufficient documentation

## 2017-12-16 LAB — BASIC METABOLIC PANEL
ANION GAP: 7 (ref 5–15)
BUN: 18 mg/dL (ref 6–20)
CO2: 26 mmol/L (ref 22–32)
Calcium: 8.8 mg/dL — ABNORMAL LOW (ref 8.9–10.3)
Chloride: 104 mmol/L (ref 101–111)
Creatinine, Ser: 0.9 mg/dL (ref 0.44–1.00)
GLUCOSE: 101 mg/dL — AB (ref 65–99)
POTASSIUM: 3.6 mmol/L (ref 3.5–5.1)
Sodium: 137 mmol/L (ref 135–145)

## 2017-12-16 LAB — CBC
HEMATOCRIT: 39.1 % (ref 36.0–46.0)
HEMOGLOBIN: 13.4 g/dL (ref 12.0–15.0)
MCH: 31.3 pg (ref 26.0–34.0)
MCHC: 34.3 g/dL (ref 30.0–36.0)
MCV: 91.4 fL (ref 78.0–100.0)
Platelets: 219 10*3/uL (ref 150–400)
RBC: 4.28 MIL/uL (ref 3.87–5.11)
RDW: 12.3 % (ref 11.5–15.5)
WBC: 8 10*3/uL (ref 4.0–10.5)

## 2017-12-16 LAB — I-STAT TROPONIN, ED: TROPONIN I, POC: 0.01 ng/mL (ref 0.00–0.08)

## 2017-12-16 NOTE — ED Triage Notes (Signed)
Pt complains of chest, back, left upper calf pain since yesterday. Pt denies shortness of breath.

## 2018-01-11 ENCOUNTER — Other Ambulatory Visit: Payer: Self-pay | Admitting: Family Medicine

## 2018-01-11 DIAGNOSIS — Z1231 Encounter for screening mammogram for malignant neoplasm of breast: Secondary | ICD-10-CM

## 2018-02-06 ENCOUNTER — Ambulatory Visit
Admission: RE | Admit: 2018-02-06 | Discharge: 2018-02-06 | Disposition: A | Discharge status: Home / Self Care | Payer: BLUE CROSS/BLUE SHIELD | Source: Ambulatory Visit | Attending: Family Medicine | Admitting: Family Medicine

## 2018-02-06 DIAGNOSIS — Z1231 Encounter for screening mammogram for malignant neoplasm of breast: Secondary | ICD-10-CM

## 2018-02-08 ENCOUNTER — Other Ambulatory Visit: Payer: Self-pay | Admitting: Family Medicine

## 2018-02-08 DIAGNOSIS — R928 Other abnormal and inconclusive findings on diagnostic imaging of breast: Secondary | ICD-10-CM

## 2018-02-10 ENCOUNTER — Ambulatory Visit
Admission: RE | Admit: 2018-02-10 | Discharge: 2018-02-10 | Disposition: A | Discharge status: Home / Self Care | Payer: BLUE CROSS/BLUE SHIELD | Source: Ambulatory Visit | Attending: Family Medicine | Admitting: Family Medicine

## 2018-02-10 DIAGNOSIS — R928 Other abnormal and inconclusive findings on diagnostic imaging of breast: Secondary | ICD-10-CM

## 2018-03-03 IMAGING — CT CT ANGIO CHEST
2 of 8 series · 19 of 36 positions shown · IV contrast (isovue)
Comparison: Chest radiograph of same day.

CLINICAL DATA: Chest pain.  Shortness of breath.

EXAM:
CT ANGIOGRAPHY CHEST WITH CONTRAST
TECHNIQUE: Multidetector CT imaging of the chest was performed using the
standard protocol during bolus administration of intravenous
contrast. Multiplanar CT image reconstructions and MIPs were
obtained to evaluate the vascular anatomy.
CONTRAST:  100 mL of Isovue 370 intravenously.

[Series 8: pe thins · axial · 0.78mm/px · z∈[-251,-17]mm · 18 of 262 slices shown]
[im 14/262  lung]
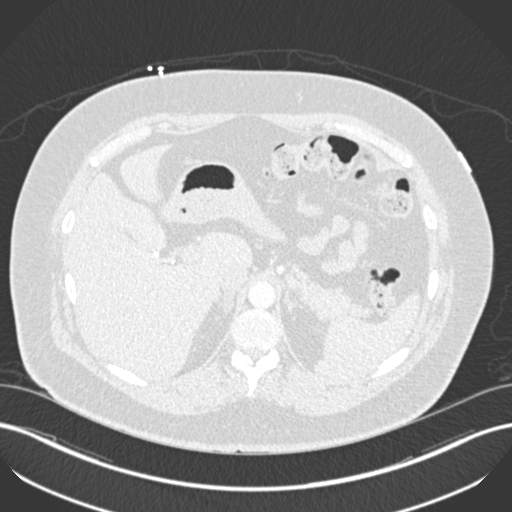
[im 28/262  mediastinal]
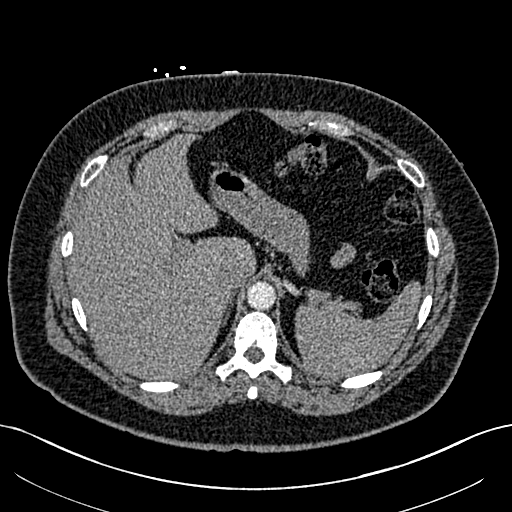
[im 42/262  lung]
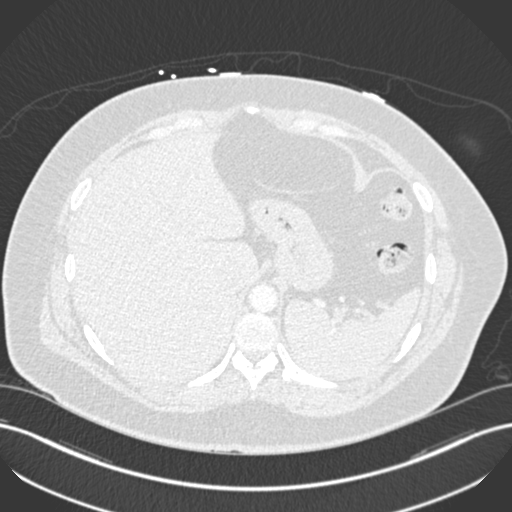
[im 55/262  mediastinal]
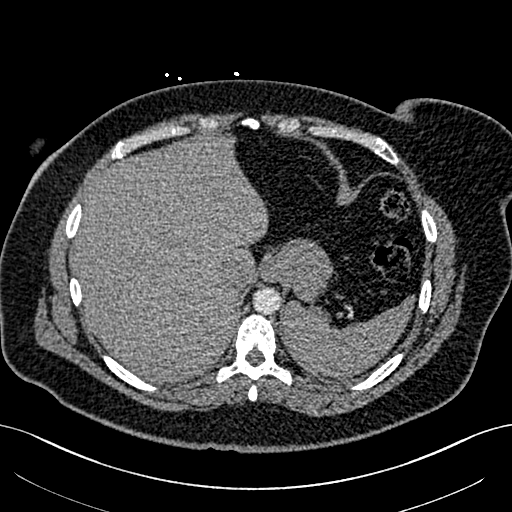
[im 69/262  lung]
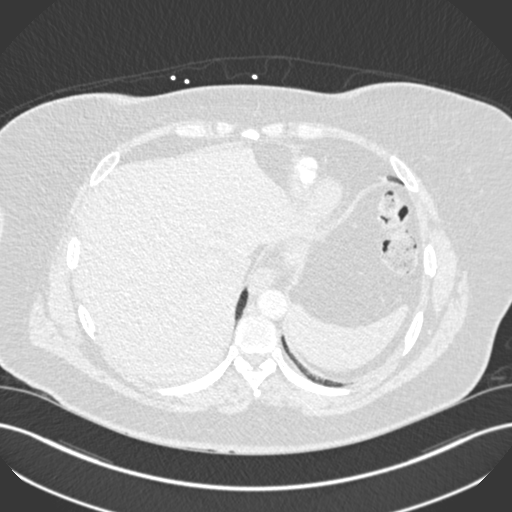
[im 83/262  mediastinal]
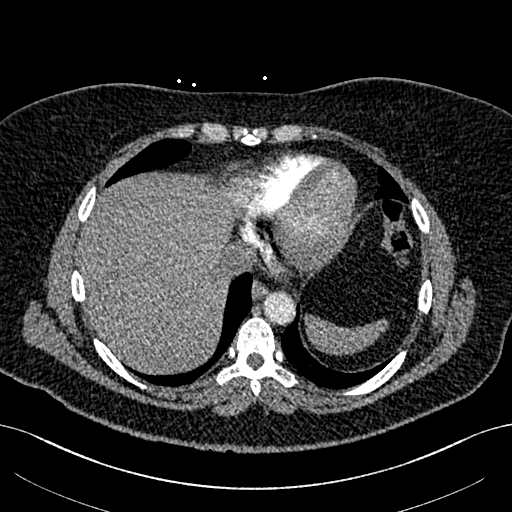
[im 97/262  lung]
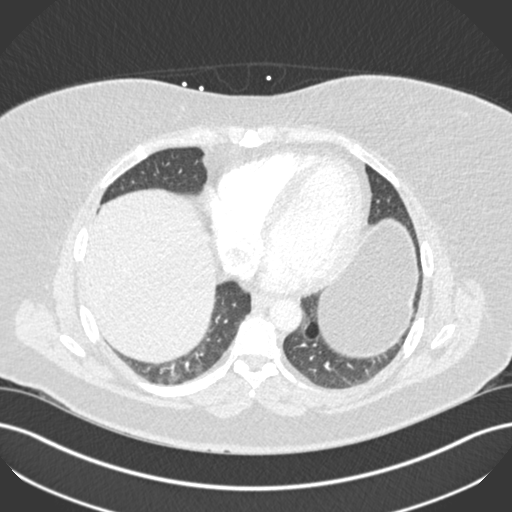
[im 110/262  mediastinal]
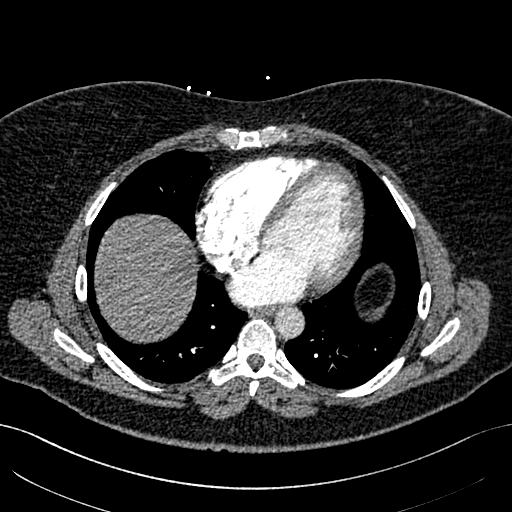
[im 124/262  lung]
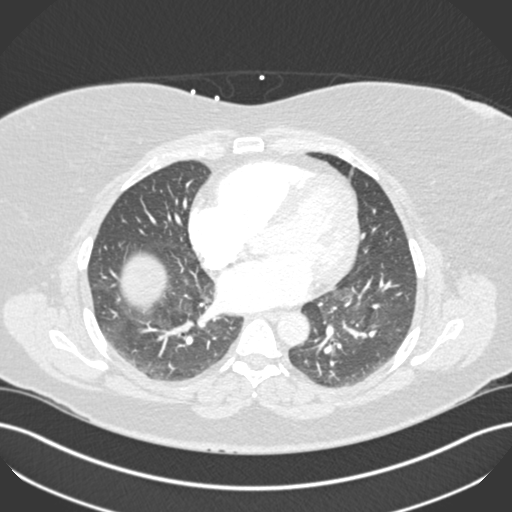
[im 138/262  mediastinal]
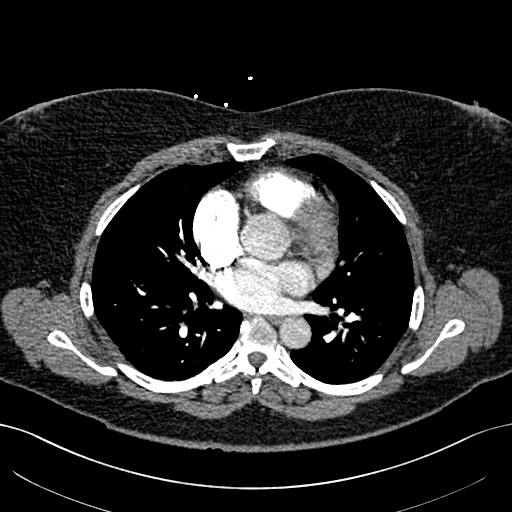
[im 152/262  lung]
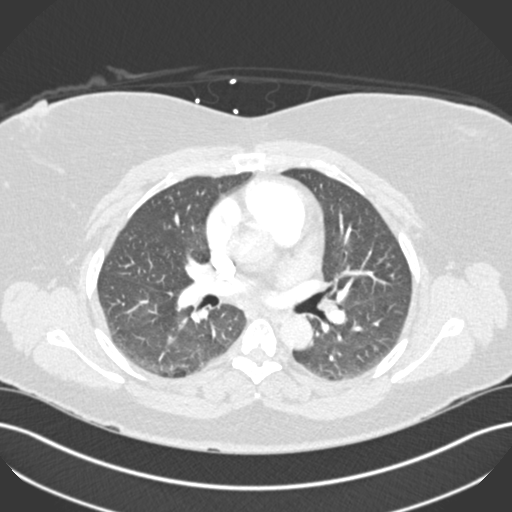
[im 165/262  mediastinal]
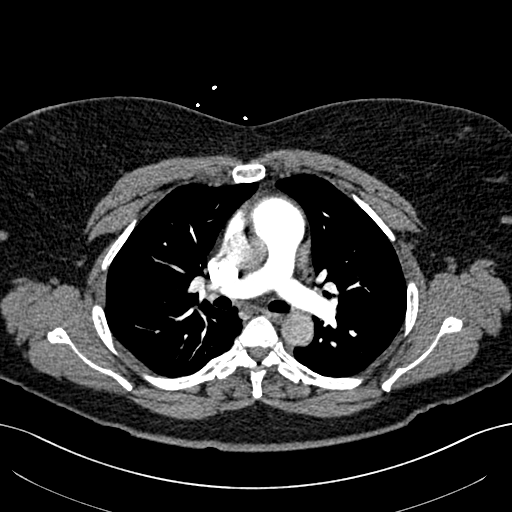
[im 179/262  lung]
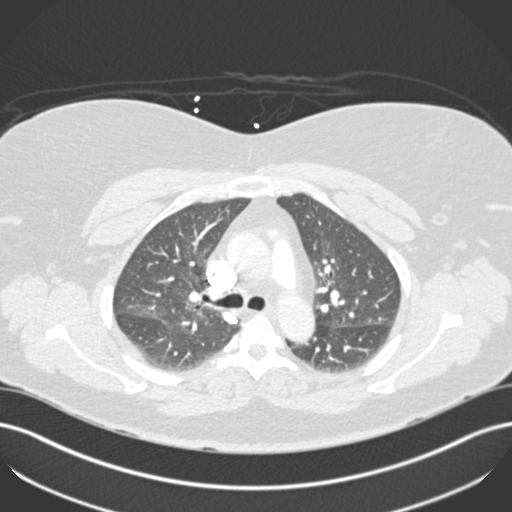
[im 193/262  mediastinal]
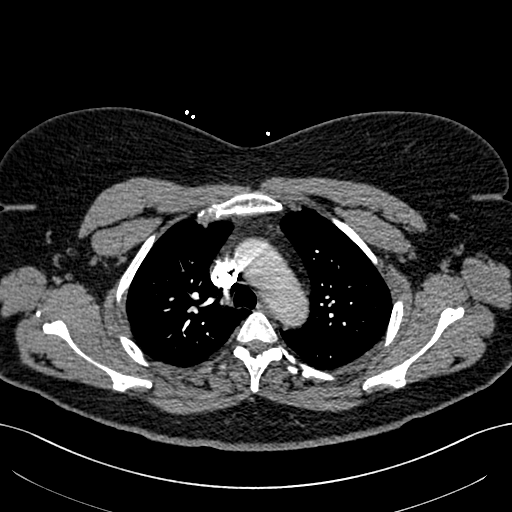
[im 207/262  lung]
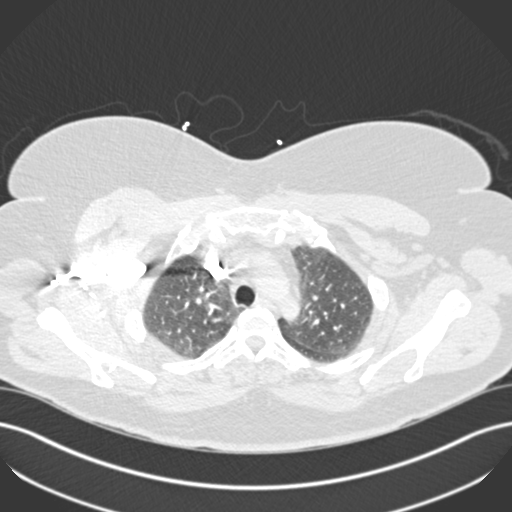
[im 220/262  mediastinal]
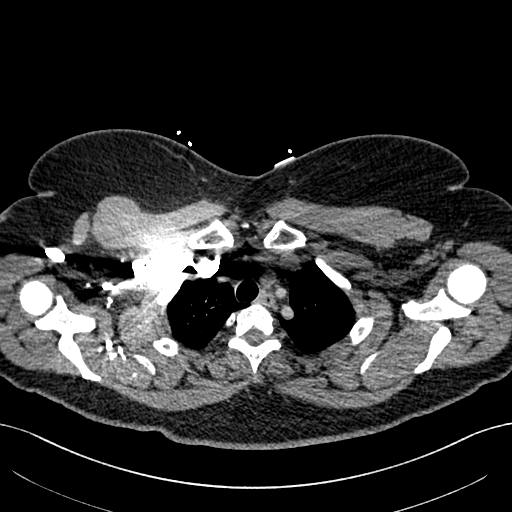
[im 234/262  lung]
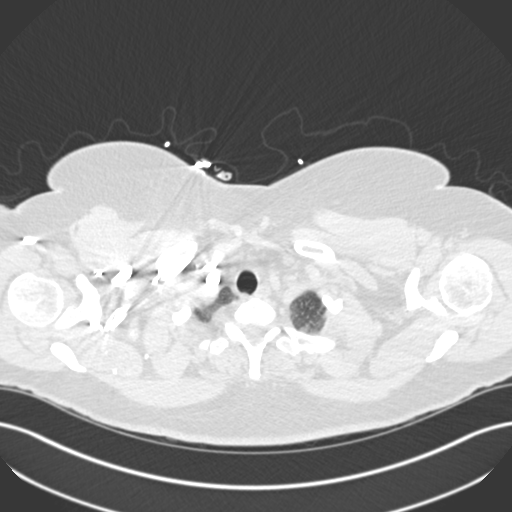
[im 248/262  mediastinal]
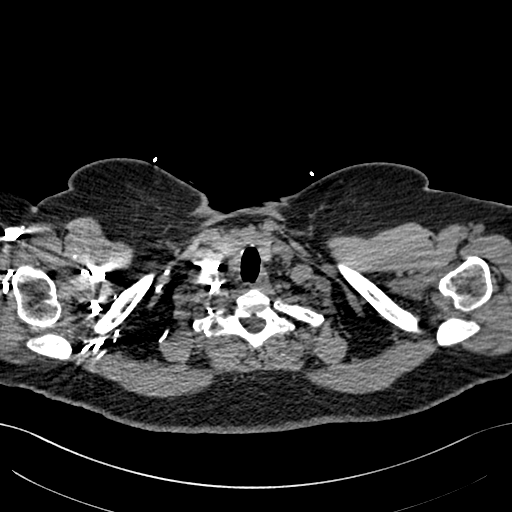

[Series 9: pe coronal mpr · coronal · 0.52mm/px · 1 of 130 slices shown]
[im 65/130  mediastinal]
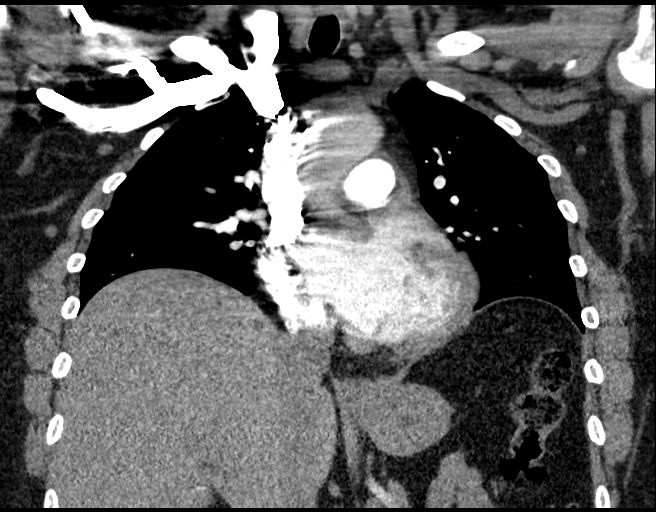

[19 of 36 positions shown; findings below may reference images not displayed]

FINDINGS: Cardiovascular: Satisfactory opacification of the pulmonary arteries
to the segmental level. No evidence of pulmonary embolism. Normal
heart size. No pericardial effusion.

Mediastinum/Nodes: No enlarged mediastinal, hilar, or axillary lymph
nodes. Thyroid gland, trachea, and esophagus demonstrate no
significant findings.

Lungs/Pleura: Lungs are clear. No pleural effusion or pneumothorax.

Upper Abdomen: No acute abnormality.

Musculoskeletal: No chest wall abnormality. No acute or significant
osseous findings.

Review of the MIP images confirms the above findings.
IMPRESSION: No definite evidence of pulmonary embolus. No acute abnormality seen
in the chest.

## 2018-07-11 ENCOUNTER — Encounter (HOSPITAL_BASED_OUTPATIENT_CLINIC_OR_DEPARTMENT_OTHER): Payer: Self-pay | Admitting: Emergency Medicine

## 2018-07-11 ENCOUNTER — Emergency Department (HOSPITAL_BASED_OUTPATIENT_CLINIC_OR_DEPARTMENT_OTHER): Payer: BLUE CROSS/BLUE SHIELD

## 2018-07-11 ENCOUNTER — Emergency Department (HOSPITAL_BASED_OUTPATIENT_CLINIC_OR_DEPARTMENT_OTHER)
Admission: EM | Admit: 2018-07-11 | Discharge: 2018-07-11 | Disposition: A | Discharge status: Home / Self Care | Payer: BLUE CROSS/BLUE SHIELD | Attending: Emergency Medicine | Admitting: Emergency Medicine

## 2018-07-11 ENCOUNTER — Other Ambulatory Visit: Payer: Self-pay

## 2018-07-11 DIAGNOSIS — I10 Essential (primary) hypertension: Secondary | ICD-10-CM | POA: Insufficient documentation

## 2018-07-11 DIAGNOSIS — M25572 Pain in left ankle and joints of left foot: Secondary | ICD-10-CM | POA: Diagnosis not present

## 2018-07-11 DIAGNOSIS — Y939 Activity, unspecified: Secondary | ICD-10-CM | POA: Diagnosis not present

## 2018-07-11 DIAGNOSIS — M25522 Pain in left elbow: Secondary | ICD-10-CM

## 2018-07-11 DIAGNOSIS — Z79899 Other long term (current) drug therapy: Secondary | ICD-10-CM | POA: Diagnosis not present

## 2018-07-11 DIAGNOSIS — Y999 Unspecified external cause status: Secondary | ICD-10-CM | POA: Insufficient documentation

## 2018-07-11 DIAGNOSIS — M25562 Pain in left knee: Secondary | ICD-10-CM | POA: Insufficient documentation

## 2018-07-11 DIAGNOSIS — Y929 Unspecified place or not applicable: Secondary | ICD-10-CM | POA: Diagnosis not present

## 2018-07-11 DIAGNOSIS — S52125A Nondisplaced fracture of head of left radius, initial encounter for closed fracture: Secondary | ICD-10-CM | POA: Insufficient documentation

## 2018-07-11 DIAGNOSIS — S59902A Unspecified injury of left elbow, initial encounter: Secondary | ICD-10-CM | POA: Diagnosis present

## 2018-07-11 DIAGNOSIS — S52042A Displaced fracture of coronoid process of left ulna, initial encounter for closed fracture: Secondary | ICD-10-CM | POA: Diagnosis not present

## 2018-07-11 DIAGNOSIS — X509XXA Other and unspecified overexertion or strenuous movements or postures, initial encounter: Secondary | ICD-10-CM | POA: Diagnosis not present

## 2018-07-11 DIAGNOSIS — W19XXXA Unspecified fall, initial encounter: Secondary | ICD-10-CM

## 2018-07-11 MED ORDER — OXYCODONE-ACETAMINOPHEN 5-325 MG PO TABS: 1.0000 | ORAL_TABLET | ORAL | 0 refills | Status: AC | PRN

## 2018-07-11 MED FILL — OXYCODONE-ACETAMINOPHEN 5-3: 5-325 | 2 days supply | Qty: 15 | Fill #0

## 2018-07-11 NOTE — ED Provider Notes (Signed)
[facility] EMERGENCY DEPARTMENT Provider Note   CSN: [id number] Arrival date & time: 07/11/18  1027     History   Chief Complaint Chief Complaint  Patient presents with  . Elbow Injury    HPI Brianna Zimmerman is a 47 y.o. female.  The history is provided by the patient, the spouse and medical records. No language interpreter was used.  Fall  This is a new problem. The current episode started 2 days ago. The problem occurs rarely. The problem has been resolved. Pertinent negatives include no chest pain, no abdominal pain, no headaches and no shortness of breath. The symptoms are aggravated by bending (bending and moving L elbow. ). Nothing relieves the symptoms. She has tried nothing for the symptoms. The treatment provided no relief.    Past Medical History:  Diagnosis Date  . Anxiety   . Cervical dysplasia   . Depression   . DVT of deep femoral vein (HCC)   . Essential hypertension 12/19/2015  . Impaired fasting glucose   . Other malaise and fatigue   . PCOS (polycystic ovarian syndrome)   . Unspecified vitamin D deficiency     Patient Active Problem List   Diagnosis Date Noted  . Long QT interval 12/19/2015  . Family history of premature coronary artery disease 12/19/2015  . Essential hypertension 12/19/2015  . Concentration deficit 05/15/2014  . Hirsutism 03/18/2014  . PCOS (polycystic ovarian syndrome) 12/21/2013  . Obesity, unspecified 12/21/2013  . Need for prophylactic vaccination and inoculation against influenza 08/29/2013  . Other malaise and fatigue 04/26/2013  . Cough 04/26/2013  . Depression 12/31/2012  . Anxiety state, unspecified 12/31/2012  . Unspecified constipation 12/31/2012    Past Surgical History:  Procedure Laterality Date  . ABDOMINAL HYSTERECTOMY     TVH-still has oaries--Cervical Dysplasia   . CHOLECYSTECTOMY       OB History    Gravida  1   Para  1   Term  1   Preterm      AB      Living  1     SAB      TAB      Ectopic      Multiple      Live Births               Home Medications    Prior to Admission medications   Medication Sig Start Date End Date Taking? Authorizing Provider  montelukast (SINGULAIR) 10 MG tablet Take by mouth. 11/04/16 04/07/19 Yes [provider]  FLUoxetine (PROZAC) 40 MG capsule Take 40 mg by mouth daily. Reported on 01/28/2016 06/18/15   [provider]  omeprazole (PRILOSEC) 40 MG capsule TK 1 C PO EVERY MORNING 04/13/18   [provider]  ranitidine (ZANTAC) 300 MG tablet TAKE ONE TABLET BY MOUTH NIGHTLY AS NEEDED FOR HEARTBURN 04/06/18   [provider]  valsartan-hydrochlorothiazide (DIOVAN-HCT) 320-25 MG tablet TK 0.5 T PO Q MORNING 04/06/18   [provider]    Family History Family History  Problem Relation Age of Onset  . Cancer Father   . Hypertension Father   . Cancer Paternal Uncle        Colon   . COPD Mother   . Stroke Mother        TIA  . Heart attack Mother   . Hypertension Mother   . Heart attack Maternal Grandmother   . Hypertension Maternal Grandmother   . Hypertension Maternal Grandfather   .  Cancer Maternal Grandfather   . Heart attack Maternal Grandfather     Social History Social History   Tobacco Use  . Smoking status: Never Smoker  . Smokeless tobacco: Never Used  Substance Use Topics  . Alcohol use: No    Alcohol/week: 0.0 standard drinks  . Drug use: No     Allergies   Ace inhibitors and Tavist   Review of Systems Review of Systems  Constitutional: Negative for chills, diaphoresis, fatigue and fever.  HENT: Negative for ear pain and sore throat.   Eyes: Negative for pain and visual disturbance.  Respiratory: Negative for cough, chest tightness and shortness of breath.   Cardiovascular: Negative for chest pain and palpitations.  Gastrointestinal: Negative for abdominal pain, constipation, diarrhea, nausea and vomiting.  Genitourinary: Negative for dysuria,  flank pain, frequency and hematuria.  Musculoskeletal: Positive for joint swelling. Negative for arthralgias, back pain, neck pain and neck stiffness.  Skin: Negative for color change and rash.  Neurological: Negative for seizures, syncope, light-headedness and headaches.  Psychiatric/Behavioral: Negative for agitation.  All other systems reviewed and are negative.    Physical Exam Updated Vital Signs BP (!) 146/92 (BP Location: Right Arm)   Pulse 62   Temp 97.6 F (36.4 C) (Oral)   Resp 16   Ht 5\' 9"  (1.753 m)   Wt 107 kg   LMP 09/20/1992   SpO2 98%   BMI 34.85 kg/m   Physical Exam  Constitutional: She is oriented to person, place, and time. She appears well-developed and well-nourished. No distress.  HENT:  Head: Normocephalic and atraumatic.  Mouth/Throat: Oropharynx is clear and moist. No oropharyngeal exudate.  Eyes: Conjunctivae are normal.  Neck: Neck supple.  Cardiovascular: Normal rate and regular rhythm.  No murmur heard. Pulmonary/Chest: Effort normal and breath sounds normal. No respiratory distress. She has no wheezes. She exhibits no tenderness.  Abdominal: Soft. She exhibits no distension. There is no tenderness.  Musculoskeletal: She exhibits tenderness. She exhibits no edema.       Left elbow: She exhibits decreased range of motion and swelling. She exhibits no deformity and no laceration. Tenderness found.       Left knee: No tenderness found.       Left ankle: She exhibits normal range of motion, no swelling, no deformity, no laceration and normal pulse. Tenderness. Lateral malleolus tenderness found.  Tenderness of L elbow. Decreased ROM due to pain. Normal sensation and strength. Normal pulse and cap refill.   Tenderess of L lateral ankle. Normal strength and sensation and pulse on foot.   Neurological: She is alert and oriented to person, place, and time. No cranial nerve deficit or sensory deficit. She exhibits normal muscle tone.  Skin: Skin is warm  and dry. Capillary refill takes less than 2 seconds. No rash noted. She is not diaphoretic. No erythema.  Psychiatric: She has a normal mood and affect.  Nursing note and vitals reviewed.    ED Treatments / Results  Labs (all labs ordered are listed, but only abnormal results are displayed) Labs Reviewed - No data to display  EKG None  Radiology Dg Elbow Complete Left  Result Date: 07/11/2018 CLINICAL DATA:  Larey Seat 2 days ago, cannot straighten elbow EXAM: LEFT ELBOW - COMPLETE 3+ VIEW COMPARISON:  None. FINDINGS: There is fracture of the proximal left ulna on the ulnar aspect. The radial head is intact. There is a small left elbow joint effusion present. No other abnormality is noted IMPRESSION: Slightly displaced fracture of  the ulnar aspect of the proximal left ulna with left elbow joint effusion. Electronically Signed   By: Dwyane Dee M.D.   On: 07/11/2018 10:27   Dg Ankle Complete Left  Result Date: 07/11/2018 CLINICAL DATA:  Larey Seat several days ago with lateral pain and swelling EXAM: LEFT ANKLE COMPLETE - 3+ VIEW COMPARISON:  None. FINDINGS: The left ankle joint appears normal. Alignment is normal. No fracture is noted. A small plantar calcaneal degenerative spur is present. IMPRESSION: No acute fracture. Electronically Signed   By: Dwyane Dee M.D.   On: 07/11/2018 10:28   Ct Elbow Left Wo Contrast  Result Date: 07/11/2018 CLINICAL DATA:  Left elbow pain, swelling and bruising since a fall several days ago. Initial encounter. EXAM: CT OF THE UPPER LEFT EXTREMITY WITHOUT CONTRAST TECHNIQUE: Multidetector CT imaging of the upper left extremity was performed according to the standard protocol. COMPARISON:  Plain films left elbow today. FINDINGS: Bones/Joint/Cartilage As seen on the comparison plain films, the patient has a fracture of the coronoid process of the ulna. The fracture is slightly oblique in orientation extending from the base of the coronoid process on the medial side in a  slightly anterior orientation before exiting the coronoid process on the lateral side. The fracture is nondisplaced. The elbow is located. Also seen is a very subtle, nondisplaced and incomplete impaction fracture of the subchondral bone plate of the radial head. The fracture involves the anterior margin of the radial head eccentric toward the medial side. The elbow is located. No other fracture is identified. Ligaments Suboptimally assessed by CT. Muscles and Tendons Intact. Soft tissues Elbow joint effusion secondary to the patient's fractures noted. IMPRESSION: Nondisplaced fracture of the coronoid process as seen on the comparison plain films. The patient also has a nondisplaced and incomplete fracture of subchondral bone plate of the radial head eccentric toward the ulnar side. Elbow joint effusion noted. The exam is otherwise negative. Electronically Signed   By: Drusilla Kanner M.D.   On: 07/11/2018 12:07   Dg Knee Complete 4 Views Left  Result Date: 07/11/2018 CLINICAL DATA:  Larey Seat several days ago with left knee pain and swelling EXAM: LEFT KNEE - COMPLETE 4+ VIEW COMPARISON:  None. FINDINGS: The left knee joint spaces are relatively well preserved. No fracture is seen. No joint effusion is noted. IMPRESSION: Negative. Electronically Signed   By: Dwyane Dee M.D.   On: 07/11/2018 10:27    Procedures Procedures (including critical care time)  Medications Ordered in ED Medications - No data to display   Initial Impression / Assessment and Plan / ED Course  I have reviewed the triage vital signs and the nursing notes.  Pertinent labs & imaging results that were available during my care of the patient were reviewed by me and considered in my medical decision making (see chart for details).     Brianna Zimmerman is a 47 y.o. female with a past medical history significant for hypertension, anxiety, PCOS, depression, and prior left elbow injury who presents with fall.  Patient reports that 2  days ago while in [place] she fell.  She reports she twisted her left ankle and fell onto the ground landing on her left elbow, left ankle, and left knee.  She reports that the elbow pain has been persistent despite her left knee and left ankle improving.  She reports she cannot straighten her left arm fully and cannot bend it all the way back.  She is right-handed however.  She  reports the pain is moderate and is swollen.  She denies any lacerations.  She reports some tingling in her entire left forearm and hand but no numbness or weakness.  She denies other injuries including no headache, neck injury or back injury.  On exam, patient has swelling and tenderness in her left elbow.  Patient has pain with flexion and extension.  Patient also had a mild amount of pain with pronation supination of her left forearm.  Patient had normal pulses, cap refill, grip strength, and sensation in the hand.  She reported the tingling in all nerve distributions.   Patient had swelling and tenderness in the left ankle.  Lateral malleolus was slightly tender with an abrasion but no bleeding or laceration seen.  Normal cap refill sensation and strength of the ankle.  Normal knee exam with no tenderness or instability appreciated.  Exam otherwise unremarkable.  Patient had x-ray of the left elbow showing evidence of an ulnar fracture.    Knee x-ray and ankle x-ray reassuring.  Suspect soft tissue injuries in these locations.    Orthopedic hand surgery was called and they recommended CT scan and splint placement.  CT confirms the ulnar fracture and also discovered a radial head fracture.  Patient placed in a splint as directed by orthopedics.  Patient will follow-up with the orthopedic hand surgery team in several days and will keep the splint in place until that time.  Patient given preserved for pain medication understanding of return precautions.  Patient had no questions or concerns and was discharged in good  condition.   Final Clinical Impressions(s) / ED Diagnoses   Final diagnoses:  Closed displaced fracture of coronoid process of left ulna, initial encounter  Closed nondisplaced fracture of head of left radius, initial encounter  Fall, initial encounter  Left elbow pain  Acute left ankle pain  Acute pain of left knee    ED Discharge Orders         Ordered    oxyCODONE-acetaminophen (PERCOCET/ROXICET) 5-325 MG tablet  Every 4 hours PRN     07/11/18 1254          Clinical Impression: 1. Closed displaced fracture of coronoid process of left ulna, initial encounter   2. Closed nondisplaced fracture of head of left radius, initial encounter   3. Fall, initial encounter   4. Left elbow pain   5. Acute left ankle pain   6. Acute pain of left knee     Disposition: Discharge  Condition: Good  I have discussed the results, Dx and Tx plan with the pt(& family if present). He/she/they expressed understanding and agree(s) with the plan. Discharge instructions discussed at great length. Strict return precautions discussed and pt &/or family have verbalized understanding of the instructions. No further questions at time of discharge.    New Prescriptions   OXYCODONE-ACETAMINOPHEN (PERCOCET/ROXICET) 5-325 MG TABLET    Take 1 tablet by mouth every 4 (four) hours as needed.    Follow Up: Dominica Severin, MD [address] [place] [address] [phone]  On 07/17/2018 Please go to Appt with Dr. Amanda Pea at 8am on Monday. Please call before then to confirm appointment.     Tegeler, Canary Brim, MD 07/11/18 [phone]

## 2018-07-11 NOTE — Discharge Instructions (Signed)
You fractured 2 of the bones in your elbow with your fall.  Please keep the splint in place and follow-up with the hand orthopedics team with Dr. Amanda Pea on Monday.  Please use the pain medicine to help with your discomfort.  Please keep your arm elevated when you can to help prevent swelling.  If any symptoms change or worsen, please return to the nearest emergency department.

## 2018-07-11 NOTE — ED Triage Notes (Addendum)
Fell 2 days ago injuring left elbow.  Is not able to straighten it completely. Has tingling and numbness in finger on left hand. Sts she has a torn ACL on left and believes she may have injured it further.

## 2018-07-11 NOTE — ED Notes (Signed)
ED Provider at bedside. 

## 2019-02-19 ENCOUNTER — Other Ambulatory Visit: Payer: Self-pay | Admitting: Family Medicine

## 2019-02-19 DIAGNOSIS — Z1231 Encounter for screening mammogram for malignant neoplasm of breast: Secondary | ICD-10-CM

## 2019-02-21 ENCOUNTER — Ambulatory Visit
Admission: RE | Admit: 2019-02-21 | Discharge: 2019-02-21 | Disposition: A | Discharge status: Home / Self Care | Payer: BC Managed Care – PPO | Source: Ambulatory Visit | Attending: Family Medicine | Admitting: Family Medicine

## 2019-02-21 ENCOUNTER — Other Ambulatory Visit: Payer: Self-pay

## 2019-02-21 DIAGNOSIS — Z1231 Encounter for screening mammogram for malignant neoplasm of breast: Secondary | ICD-10-CM

## 2019-12-13 ENCOUNTER — Ambulatory Visit: Payer: BC Managed Care – PPO | Attending: Internal Medicine

## 2019-12-13 DIAGNOSIS — Z23 Encounter for immunization: Secondary | ICD-10-CM

## 2019-12-13 NOTE — Progress Notes (Signed)
   Covid-19 Vaccination Clinic  Name:  Brianna Zimmerman    MRN: [id number] DOB: 1971-09-07  12/13/2019  [name] was observed post Covid-19 immunization for 15 minutes without incident. She was provided with Vaccine Information Sheet and instruction to access the V-Safe system.   [name] was instructed to call 911 with any severe reactions post vaccine: Marland Kitchen Difficulty breathing  . Swelling of face and throat  . A fast heartbeat  . A bad rash all over body  . Dizziness and weakness   Immunizations Administered    Name Date Dose VIS Date Route   Pfizer COVID-19 Vaccine 12/13/2019  3:34 PM 0.3 mL 08/31/2019 Intramuscular   Manufacturer: [employer], [employer]   Lot: [id number]   NDC: [address]-0

## 2020-01-07 ENCOUNTER — Ambulatory Visit: Payer: BC Managed Care – PPO | Attending: Internal Medicine

## 2020-01-07 DIAGNOSIS — Z23 Encounter for immunization: Secondary | ICD-10-CM

## 2020-01-07 NOTE — Progress Notes (Signed)
   Covid-19 Vaccination Clinic  Name:  Brianna Zimmerman    MRN: [id number] DOB: 04/01/1971  01/07/2020  [name] was observed post Covid-19 immunization for 15 minutes without incident. She was provided with Vaccine Information Sheet and instruction to access the V-Safe system.   [name] was instructed to call 911 with any severe reactions post vaccine: Marland Kitchen Difficulty breathing  . Swelling of face and throat  . A fast heartbeat  . A bad rash all over body  . Dizziness and weakness   Immunizations Administered    Name Date Dose VIS Date Route   Pfizer COVID-19 Vaccine 01/07/2020  1:28 PM 0.3 mL 11/14/2018 Intramuscular   Manufacturer: [employer], [employer]   Lot: [id number]   NDC: [address]-9

## 2020-03-25 ENCOUNTER — Other Ambulatory Visit: Payer: Self-pay | Admitting: Family Medicine

## 2020-03-25 DIAGNOSIS — Z1231 Encounter for screening mammogram for malignant neoplasm of breast: Secondary | ICD-10-CM

## 2020-04-02 ENCOUNTER — Ambulatory Visit
Admission: RE | Admit: 2020-04-02 | Discharge: 2020-04-02 | Disposition: A | Discharge status: Home / Self Care | Payer: BC Managed Care – PPO | Source: Ambulatory Visit | Attending: Family Medicine | Admitting: Family Medicine

## 2020-04-02 ENCOUNTER — Other Ambulatory Visit: Payer: Self-pay

## 2020-04-02 DIAGNOSIS — Z1231 Encounter for screening mammogram for malignant neoplasm of breast: Secondary | ICD-10-CM

## 2020-09-18 ENCOUNTER — Other Ambulatory Visit: Payer: Self-pay

## 2020-09-18 ENCOUNTER — Ambulatory Visit
Admission: EM | Admit: 2020-09-18 | Discharge: 2020-09-18 | Disposition: A | Discharge status: Home / Self Care | Payer: BC Managed Care – PPO | Attending: Emergency Medicine | Admitting: Emergency Medicine

## 2020-09-18 DIAGNOSIS — J069 Acute upper respiratory infection, unspecified: Secondary | ICD-10-CM

## 2020-09-18 DIAGNOSIS — R509 Fever, unspecified: Secondary | ICD-10-CM

## 2020-09-18 MED ORDER — DM-GUAIFENESIN ER 30-600 MG PO TB12: 1.0000 | ORAL_TABLET | Freq: Two times a day (BID) | ORAL | 0 refills | Status: AC

## 2020-09-18 MED ORDER — CETIRIZINE HCL 10 MG PO CAPS: 10.0000 mg | ORAL_CAPSULE | Freq: Every day | ORAL | 0 refills | Status: AC

## 2020-09-18 MED ORDER — BENZONATATE 200 MG PO CAPS: 200.0000 mg | ORAL_CAPSULE | Freq: Three times a day (TID) | ORAL | 0 refills | Status: AC | PRN

## 2020-09-18 NOTE — ED Provider Notes (Signed)
[facility]    CSN: [id number] Arrival date & time: 09/18/20  0940      History   Chief Complaint Chief Complaint  Patient presents with   Generalized Body Aches   Fever    HPI Brianna Zimmerman is a 49 y.o. female history of PCOS presenting today for evaluation of fever, body aches, cough and headache.  Symptoms began yesterday.  Using Tylenol without relief.  Denies any known exposures.  Does have a lot of fatigue, some nausea.  Has history of QT prolongation.  HPI  Past Medical History:  Diagnosis Date   Anxiety    Cervical dysplasia    Depression    DVT of deep femoral vein (HCC)    Essential hypertension 12/19/2015   Impaired fasting glucose    Other malaise and fatigue    PCOS (polycystic ovarian syndrome)    Unspecified vitamin D deficiency     Patient Active Problem List   Diagnosis Date Noted   Long QT interval 12/19/2015   Family history of premature coronary artery disease 12/19/2015   Essential hypertension 12/19/2015   Concentration deficit 05/15/2014   Hirsutism 03/18/2014   PCOS (polycystic ovarian syndrome) 12/21/2013   Obesity, unspecified 12/21/2013   Need for prophylactic vaccination and inoculation against influenza 08/29/2013   Other malaise and fatigue 04/26/2013   Cough 04/26/2013   Depression 12/31/2012   Anxiety state, unspecified 12/31/2012   Unspecified constipation 12/31/2012    Past Surgical History:  Procedure Laterality Date   ABDOMINAL HYSTERECTOMY     TVH-still has oaries--Cervical Dysplasia    CHOLECYSTECTOMY      OB History    Gravida  1   Para  1   Term  1   Preterm      AB      Living  1     SAB      IAB      Ectopic      Multiple      Live Births               Home Medications    Prior to Admission medications   Medication Sig Start Date End Date Taking? Authorizing Provider  benzonatate (TESSALON) 200 MG capsule Take 1 capsule (200 mg total) by  mouth 3 (three) times daily as needed for up to 7 days for cough. 09/18/20 09/25/20 Yes Ahlayah Tarkowski C, PA-C  Cetirizine HCl 10 MG CAPS Take 1 capsule (10 mg total) by mouth daily for 10 days. 09/18/20 09/28/20 Yes Franklin Clapsaddle C, PA-C  dextromethorphan-guaiFENesin (MUCINEX DM) 30-600 MG 12hr tablet Take 1 tablet by mouth 2 (two) times daily. 09/18/20  Yes Chico Cawood C, PA-C  FLUoxetine (PROZAC) 40 MG capsule Take 40 mg by mouth daily. Reported on 01/28/2016 06/18/15   [provider]  omeprazole (PRILOSEC) 40 MG capsule TK 1 C PO EVERY MORNING 04/13/18   [provider]  oxyCODONE-acetaminophen (PERCOCET/ROXICET) 5-325 MG tablet Take 1 tablet by mouth every 4 (four) hours as needed. 07/11/18   Tegeler, Canary Brim, MD  ranitidine (ZANTAC) 300 MG tablet TAKE ONE TABLET BY MOUTH NIGHTLY AS NEEDED FOR HEARTBURN 04/06/18   [provider]  valsartan-hydrochlorothiazide (DIOVAN-HCT) 320-25 MG tablet TK 0.5 T PO Q MORNING 04/06/18   [provider]    Family History Family History  Problem Relation Age of Onset   Cancer Father    Hypertension Father    Cancer Paternal Uncle  Colon    COPD Mother    Stroke Mother        TIA   Heart attack Mother    Hypertension Mother    Heart attack Maternal Grandmother    Hypertension Maternal Grandmother    Hypertension Maternal Grandfather    Cancer Maternal Grandfather    Heart attack Maternal Grandfather     Social History Social History   Tobacco Use   Smoking status: Never Smoker   Smokeless tobacco: Never Used  Building services engineer Use: Never used  Substance Use Topics   Alcohol use: No    Alcohol/week: 0.0 standard drinks   Drug use: No     Allergies   Ace inhibitors and Tavist   Review of Systems Review of Systems  Constitutional: Positive for chills and fatigue. Negative for activity change, appetite change and fever.  HENT: Positive for congestion. Negative for ear  pain, rhinorrhea, sinus pressure, sore throat and trouble swallowing.   Eyes: Negative for discharge and redness.  Respiratory: Positive for cough. Negative for chest tightness and shortness of breath.   Cardiovascular: Negative for chest pain.  Gastrointestinal: Negative for abdominal pain, diarrhea, nausea and vomiting.  Musculoskeletal: Negative for myalgias.  Skin: Negative for rash.  Neurological: Negative for dizziness, light-headedness and headaches.     Physical Exam Triage Vital Signs ED Triage Vitals  Enc Vitals Group     BP 09/18/20 1056 113/83     Pulse Rate 09/18/20 1056 79     Resp 09/18/20 1056 20     Temp 09/18/20 1056 99.1 F (37.3 C)     Temp src --      SpO2 09/18/20 1056 94 %     Weight --      Height --      Head Circumference --      Peak Flow --      Pain Score 09/18/20 1055 7     Pain Loc --      Pain Edu? --      Excl. in GC? --    No data found.  Updated Vital Signs BP 113/83    Pulse 79    Temp 99.1 F (37.3 C)    Resp 20    LMP 09/20/1992    SpO2 94%   Visual Acuity Right Eye Distance:   Left Eye Distance:   Bilateral Distance:    Right Eye Near:   Left Eye Near:    Bilateral Near:     Physical Exam Vitals and nursing note reviewed.  Constitutional:      Appearance: She is well-developed and well-nourished.     Comments: No acute distress  HENT:     Head: Normocephalic and atraumatic.     Ears:     Comments: Bilateral ears without tenderness to palpation of external auricle, tragus and mastoid, EAC's without erythema or swelling, TM's with good bony landmarks and cone of light. Non erythematous.     Nose: Nose normal.     Mouth/Throat:     Comments: Oral mucosa pink and moist, no tonsillar enlargement or exudate. Posterior pharynx patent and nonerythematous, no uvula deviation or swelling. Normal phonation. Eyes:     Conjunctiva/sclera: Conjunctivae normal.  Cardiovascular:     Rate and Rhythm: Normal rate.  Pulmonary:      Effort: Pulmonary effort is normal. No respiratory distress.     Comments: Breathing comfortably at rest, CTABL, no wheezing, rales or other adventitious sounds auscultated Abdominal:  General: There is no distension.  Musculoskeletal:        General: Normal range of motion.     Cervical back: Neck supple.  Skin:    General: Skin is warm and dry.  Neurological:     Mental Status: She is alert and oriented to person, place, and time.  Psychiatric:        Mood and Affect: Mood and affect normal.      UC Treatments / Results  Labs (all labs ordered are listed, but only abnormal results are displayed) Labs Reviewed  COVID-19, FLU A+B NAA    EKG   Radiology No results found.  Procedures Procedures (including critical care time)  Medications Ordered in UC Medications - No data to display  Initial Impression / Assessment and Plan / UC Course  I have reviewed the triage vital signs and the nursing notes.  Pertinent labs & imaging results that were available during my care of the patient were reviewed by me and considered in my medical decision making (see chart for details).     Viral URI-Covid test pending.  Recommend symptomatic and supportive care.  Lungs clear to auscultation today.  Deferring albuterol due to history of QT prolongation.  Discussed strict return precautions. Patient verbalized understanding and is agreeable with plan.  Final Clinical Impressions(s) / UC Diagnoses   Final diagnoses:  Fever, unspecified  Viral URI with cough     Discharge Instructions     Covid test pending, monitor my chart for results Daily cetirizine help with congestion and drainage Mucinex DM for further cough and congestion relief Tessalon for cough as needed Honey and hot tea Follow-up if not improving or worsening    ED Prescriptions    Medication Sig Dispense Auth. Provider   benzonatate (TESSALON) 200 MG capsule Take 1 capsule (200 mg total) by mouth 3  (three) times daily as needed for up to 7 days for cough. 28 capsule Kostas Marrow C, PA-C   dextromethorphan-guaiFENesin (MUCINEX DM) 30-600 MG 12hr tablet Take 1 tablet by mouth 2 (two) times daily. 20 tablet Empress Newmann C, PA-C   Cetirizine HCl 10 MG CAPS Take 1 capsule (10 mg total) by mouth daily for 10 days. 10 capsule Anastacia Reinecke, [place] C, PA-C     PDMP not reviewed this encounter.   Lew Dawes, [place] 09/18/20 1118

## 2020-09-18 NOTE — Discharge Instructions (Signed)
Covid test pending, monitor my chart for results Daily cetirizine help with congestion and drainage Mucinex DM for further cough and congestion relief Tessalon for cough as needed Honey and hot tea Follow-up if not improving or worsening

## 2020-09-18 NOTE — ED Triage Notes (Signed)
Pt presents with complaints of fever, body aches, nonproductive cough, pain with cough, and headache that started yesterday. Reports taking otc tylenol with no relief.

## 2020-09-20 LAB — COVID-19, FLU A+B NAA
Influenza A, NAA: NOT DETECTED
Influenza B, NAA: NOT DETECTED
SARS-CoV-2, NAA: DETECTED — AB

## 2021-05-08 ENCOUNTER — Other Ambulatory Visit: Payer: Self-pay | Admitting: Family Medicine

## 2021-05-08 DIAGNOSIS — Z1231 Encounter for screening mammogram for malignant neoplasm of breast: Secondary | ICD-10-CM

## 2021-05-26 ENCOUNTER — Other Ambulatory Visit: Payer: Self-pay

## 2021-05-26 ENCOUNTER — Ambulatory Visit
Admission: RE | Admit: 2021-05-26 | Discharge: 2021-05-26 | Disposition: A | Discharge status: Home / Self Care | Payer: BC Managed Care – PPO | Source: Ambulatory Visit | Attending: Family Medicine | Admitting: Family Medicine

## 2021-05-26 DIAGNOSIS — Z1231 Encounter for screening mammogram for malignant neoplasm of breast: Secondary | ICD-10-CM

## 2022-05-10 ENCOUNTER — Other Ambulatory Visit: Payer: Self-pay | Admitting: Family Medicine

## 2022-05-10 DIAGNOSIS — Z1231 Encounter for screening mammogram for malignant neoplasm of breast: Secondary | ICD-10-CM

## 2022-06-08 ENCOUNTER — Ambulatory Visit: Payer: BC Managed Care – PPO

## 2022-07-01 ENCOUNTER — Ambulatory Visit: Payer: BC Managed Care – PPO

## 2022-07-12 ENCOUNTER — Ambulatory Visit
Admission: RE | Admit: 2022-07-12 | Discharge: 2022-07-12 | Disposition: A | Discharge status: Home / Self Care | Payer: BC Managed Care – PPO | Source: Ambulatory Visit | Attending: Family Medicine | Admitting: Family Medicine

## 2022-07-12 DIAGNOSIS — Z1231 Encounter for screening mammogram for malignant neoplasm of breast: Secondary | ICD-10-CM

## 2023-06-09 ENCOUNTER — Other Ambulatory Visit: Payer: Self-pay | Admitting: Family Medicine

## 2023-06-09 DIAGNOSIS — Z1231 Encounter for screening mammogram for malignant neoplasm of breast: Secondary | ICD-10-CM

## 2023-07-14 ENCOUNTER — Ambulatory Visit
Admission: RE | Admit: 2023-07-14 | Discharge: 2023-07-14 | Disposition: A | Discharge status: Home / Self Care | Payer: BC Managed Care – PPO | Source: Ambulatory Visit | Attending: Family Medicine | Admitting: Family Medicine

## 2023-07-14 DIAGNOSIS — Z1231 Encounter for screening mammogram for malignant neoplasm of breast: Secondary | ICD-10-CM

## 2024-06-11 ENCOUNTER — Other Ambulatory Visit: Payer: Self-pay | Admitting: Obstetrics & Gynecology

## 2024-06-11 DIAGNOSIS — Z1231 Encounter for screening mammogram for malignant neoplasm of breast: Secondary | ICD-10-CM

## 2024-06-25 ENCOUNTER — Encounter: Admitting: Hematology and Oncology

## 2024-06-25 ENCOUNTER — Other Ambulatory Visit

## 2024-07-16 ENCOUNTER — Ambulatory Visit
Admission: RE | Admit: 2024-07-16 | Discharge: 2024-07-16 | Disposition: A | Discharge status: Home / Self Care | Source: Ambulatory Visit | Attending: Obstetrics & Gynecology | Admitting: Obstetrics & Gynecology

## 2024-07-16 DIAGNOSIS — Z1231 Encounter for screening mammogram for malignant neoplasm of breast: Secondary | ICD-10-CM
